# Patient Record
Sex: Female | Born: 1955 | Race: White | Hispanic: No | Marital: Married | Smoking: Former smoker
Health system: Southern US, Community
[De-identification: ages and names within clinical notes are randomized; demographics above are authoritative.]

## PROBLEM LIST (undated history)

## (undated) DIAGNOSIS — N393 Stress incontinence (female) (male): Secondary | ICD-10-CM

## (undated) DIAGNOSIS — K219 Gastro-esophageal reflux disease without esophagitis: Secondary | ICD-10-CM

## (undated) DIAGNOSIS — R51 Headache: Secondary | ICD-10-CM

## (undated) DIAGNOSIS — R519 Headache, unspecified: Secondary | ICD-10-CM

## (undated) DIAGNOSIS — E785 Hyperlipidemia, unspecified: Secondary | ICD-10-CM

## (undated) DIAGNOSIS — I10 Essential (primary) hypertension: Secondary | ICD-10-CM

## (undated) DIAGNOSIS — K449 Diaphragmatic hernia without obstruction or gangrene: Secondary | ICD-10-CM

## (undated) HISTORY — DX: Stress incontinence (female) (male): N39.3

## (undated) HISTORY — PX: TUBAL LIGATION: SHX77

## (undated) HISTORY — DX: Hyperlipidemia, unspecified: E78.5

## (undated) HISTORY — PX: BREAST SURGERY: SHX581

## (undated) HISTORY — DX: Essential (primary) hypertension: I10

## (undated) HISTORY — PX: BREAST EXCISIONAL BIOPSY: SUR124

---
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
## 2006-06-23 ENCOUNTER — Ambulatory Visit: Payer: Self-pay | Admitting: Obstetrics and Gynecology

## 2006-09-07 ENCOUNTER — Ambulatory Visit: Payer: Self-pay | Admitting: Gastroenterology

## 2006-09-07 HISTORY — PX: COLONOSCOPY: SHX174

## 2007-08-08 ENCOUNTER — Ambulatory Visit: Payer: Self-pay | Admitting: Nurse Practitioner

## 2007-08-20 ENCOUNTER — Ambulatory Visit: Payer: Self-pay | Admitting: Nurse Practitioner

## 2008-11-14 ENCOUNTER — Ambulatory Visit: Payer: Self-pay | Admitting: Unknown Physician Specialty

## 2008-11-18 ENCOUNTER — Ambulatory Visit: Payer: Self-pay | Admitting: Nurse Practitioner

## 2010-04-03 ENCOUNTER — Ambulatory Visit: Payer: Self-pay | Admitting: Internal Medicine

## 2010-07-12 ENCOUNTER — Ambulatory Visit: Payer: Self-pay | Admitting: Family Medicine

## 2013-12-06 ENCOUNTER — Ambulatory Visit: Payer: Self-pay | Admitting: Nurse Practitioner

## 2014-07-21 ENCOUNTER — Ambulatory Visit: Payer: Self-pay | Admitting: Gastroenterology

## 2014-07-21 HISTORY — PX: ESOPHAGOGASTRODUODENOSCOPY: SHX1529

## 2014-12-09 ENCOUNTER — Ambulatory Visit
Admit: 2014-12-09 | Disposition: A | Discharge status: Home / Self Care | Payer: Self-pay | Attending: Nurse Practitioner | Admitting: Nurse Practitioner

## 2014-12-22 LAB — SURGICAL PATHOLOGY

## 2015-12-29 ENCOUNTER — Emergency Department: Payer: BC Managed Care – PPO

## 2015-12-29 ENCOUNTER — Encounter: Payer: Self-pay | Admitting: Emergency Medicine

## 2015-12-29 ENCOUNTER — Emergency Department
Admission: EM | Admit: 2015-12-29 | Discharge: 2015-12-29 | Disposition: A | Discharge status: Home / Self Care | Payer: BC Managed Care – PPO | Attending: Emergency Medicine | Admitting: Emergency Medicine

## 2015-12-29 DIAGNOSIS — K859 Acute pancreatitis without necrosis or infection, unspecified: Secondary | ICD-10-CM | POA: Insufficient documentation

## 2015-12-29 DIAGNOSIS — K219 Gastro-esophageal reflux disease without esophagitis: Secondary | ICD-10-CM | POA: Insufficient documentation

## 2015-12-29 DIAGNOSIS — K8020 Calculus of gallbladder without cholecystitis without obstruction: Secondary | ICD-10-CM | POA: Diagnosis not present

## 2015-12-29 DIAGNOSIS — R110 Nausea: Secondary | ICD-10-CM | POA: Diagnosis present

## 2015-12-29 HISTORY — DX: Gastro-esophageal reflux disease without esophagitis: K21.9

## 2015-12-29 LAB — URINALYSIS COMPLETE WITH MICROSCOPIC (ARMC ONLY)
BILIRUBIN URINE: NEGATIVE
Bacteria, UA: NONE SEEN
Glucose, UA: NEGATIVE mg/dL
KETONES UR: NEGATIVE mg/dL
Nitrite: NEGATIVE
PH: 6 (ref 5.0–8.0)
PROTEIN: NEGATIVE mg/dL
SPECIFIC GRAVITY, URINE: 1.009 (ref 1.005–1.030)

## 2015-12-29 LAB — LIPASE, BLOOD: LIPASE: 237 U/L — AB (ref 11–51)

## 2015-12-29 LAB — COMPREHENSIVE METABOLIC PANEL
ALK PHOS: 87 U/L (ref 38–126)
ALT: 14 U/L (ref 14–54)
AST: 17 U/L (ref 15–41)
Albumin: 4.1 g/dL (ref 3.5–5.0)
Anion gap: 12 (ref 5–15)
BILIRUBIN TOTAL: 0.4 mg/dL (ref 0.3–1.2)
BUN: 6 mg/dL (ref 6–20)
CALCIUM: 8.9 mg/dL (ref 8.9–10.3)
CO2: 19 mmol/L — ABNORMAL LOW (ref 22–32)
CREATININE: 0.64 mg/dL (ref 0.44–1.00)
Chloride: 109 mmol/L (ref 101–111)
Glucose, Bld: 111 mg/dL — ABNORMAL HIGH (ref 65–99)
Potassium: 3.6 mmol/L (ref 3.5–5.1)
Sodium: 140 mmol/L (ref 135–145)
TOTAL PROTEIN: 7.5 g/dL (ref 6.5–8.1)

## 2015-12-29 LAB — CBC
HCT: 39.4 % (ref 35.0–47.0)
Hemoglobin: 13.5 g/dL (ref 12.0–16.0)
MCH: 30.4 pg (ref 26.0–34.0)
MCHC: 34.2 g/dL (ref 32.0–36.0)
MCV: 88.8 fL (ref 80.0–100.0)
PLATELETS: 366 10*3/uL (ref 150–440)
RBC: 4.44 MIL/uL (ref 3.80–5.20)
RDW: 14.1 % (ref 11.5–14.5)
WBC: 12.3 10*3/uL — AB (ref 3.6–11.0)

## 2015-12-29 MED ORDER — DOCUSATE SODIUM 100 MG PO CAPS: ORAL_CAPSULE | ORAL | Status: DC

## 2015-12-29 MED ORDER — OXYCODONE-ACETAMINOPHEN 5-325 MG PO TABS: 1.0000 | ORAL_TABLET | ORAL | Status: DC | PRN

## 2015-12-29 MED ORDER — ONDANSETRON 4 MG PO TBDP: ORAL_TABLET | ORAL | Status: DC

## 2015-12-29 MED ORDER — ONDANSETRON 4 MG PO TBDP
4.0000 mg | ORAL_TABLET | Freq: Once | ORAL | Status: AC
  Administered 2015-12-29: 4 mg via ORAL
  Filled 2015-12-29: qty 1

## 2015-12-29 NOTE — Discharge Instructions (Signed)
As we discussed, you have acute pancreatitis from an uncertain cause.  It is possible you may have passed a small gallstone that is irritating your pancreas.  We offered admission to the hospital for further management, but you declined at this time in favor of outpatient management with pain medication, nausea medication, and close follow-up.  Please return immediately to the emergency department if you develop any new or worsening symptoms or if you are unable to eat or drink sufficiently due to nausea or vomiting, if you develop a fever, uncontrolled pain, etc.   Acute Pancreatitis Acute pancreatitis is a disease in which the pancreas becomes suddenly inflamed. The pancreas is a large gland located behind your stomach. The pancreas produces enzymes that help digest food. The pancreas also releases the hormones glucagon and insulin that help regulate blood sugar. Damage to the pancreas occurs when the digestive enzymes from the pancreas are activated and begin attacking the pancreas before being released into the intestine. Most acute attacks last a couple of days and can cause serious complications. Some people become dehydrated and develop low blood pressure. In severe cases, bleeding into the pancreas can lead to shock and can be life-threatening. The lungs, heart, and kidneys may fail. CAUSES  Pancreatitis can happen to anyone. In some cases, the cause is unknown. Most cases are caused by:  Alcohol abuse.  Gallstones. Other less common causes are:  Certain medicines.  Exposure to certain chemicals.  Infection.  Damage caused by an accident (trauma).  Abdominal surgery. SYMPTOMS   Pain in the upper abdomen that may radiate to the back.  Tenderness and swelling of the abdomen.  Nausea and vomiting. DIAGNOSIS  Your caregiver will perform a physical exam. Blood and stool tests may be done to confirm the diagnosis. Imaging tests may also be done, such as X-rays, CT scans, or an  ultrasound of the abdomen. TREATMENT  Treatment usually requires a stay in the hospital. Treatment may include:  Pain medicine.  Fluid replacement through an intravenous line (IV).  Placing a tube in the stomach to remove stomach contents and control vomiting.  Not eating for 3 or 4 days. This gives your pancreas a rest, because enzymes are not being produced that can cause further damage.  Antibiotic medicines if your condition is caused by an infection.  Surgery of the pancreas or gallbladder. HOME CARE INSTRUCTIONS   Follow the diet advised by your caregiver. This may involve avoiding alcohol and decreasing the amount of fat in your diet.  Eat smaller, more frequent meals. This reduces the amount of digestive juices the pancreas produces.  Drink enough fluids to keep your urine clear or pale yellow.  Only take over-the-counter or prescription medicines as directed by your caregiver.  Avoid drinking alcohol if it caused your condition.  Do not smoke.  Get plenty of rest.  Check your blood sugar at home as directed by your caregiver.  Keep all follow-up appointments as directed by your caregiver. SEEK MEDICAL CARE IF:   You do not recover as quickly as expected.  You develop new or worsening symptoms.  You have persistent pain, weakness, or nausea.  You recover and then have another episode of pain. SEEK IMMEDIATE MEDICAL CARE IF:   You are unable to eat or keep fluids down.  Your pain becomes severe.  You have a fever or persistent symptoms for more than 2 to 3 days.  You have a fever and your symptoms suddenly get worse.  Your skin  or the white part of your eyes turn yellow (jaundice).  You develop vomiting.  You feel dizzy, or you faint.  Your blood sugar is high (over 300 mg/dL). MAKE SURE YOU:   Understand these instructions.  Will watch your condition.  Will get help right away if you are not doing well or get worse.   This information is not  intended to replace advice given to you by your health care provider. Make sure you discuss any questions you have with your health care provider.   Document Released: 08/15/2005 Document Revised: 02/14/2012 Document Reviewed: 11/24/2011 Elsevier Interactive Patient Education 2016 [employer].  Cholelithiasis Cholelithiasis (also called gallstones) is a form of gallbladder disease in which gallstones form in your gallbladder. The gallbladder is an organ that stores bile made in the liver, which helps digest fats. Gallstones begin as small crystals and slowly grow into stones. Gallstone pain occurs when the gallbladder spasms and a gallstone is blocking the duct. Pain can also occur when a stone passes out of the duct.  RISK FACTORS  Being female.   Having multiple pregnancies. Health care providers sometimes advise removing diseased gallbladders before future pregnancies.   Being obese.  Eating a diet heavy in fried foods and fat.   Being older than 37 years and increasing age.   Prolonged use of medicines containing female hormones.   Having diabetes mellitus.   Rapidly losing weight.   Having a family history of gallstones (heredity).  SYMPTOMS  Nausea.   Vomiting.  Abdominal pain.   Yellowing of the skin (jaundice).   Sudden pain. It may persist from several minutes to several hours.  Fever.   Tenderness to the touch. In some cases, when gallstones do not move into the bile duct, people have no pain or symptoms. These are called "silent" gallstones.  TREATMENT Silent gallstones do not need treatment. In severe cases, emergency surgery may be required. Options for treatment include:  Surgery to remove the gallbladder. This is the most common treatment.  Medicines. These do not always work and may take 6-12 months or more to work.  Shock wave treatment (extracorporeal biliary lithotripsy). In this treatment an ultrasound machine sends shock waves to  the gallbladder to break gallstones into smaller pieces that can pass into the intestines or be dissolved by medicine. HOME CARE INSTRUCTIONS   Only take over-the-counter or prescription medicines for pain, discomfort, or fever as directed by your health care provider.   Follow a low-fat diet until seen again by your health care provider. Fat causes the gallbladder to contract, which can result in pain.   Follow up with your health care provider as directed. Attacks are almost always recurrent and surgery is usually required for permanent treatment.  SEEK IMMEDIATE MEDICAL CARE IF:   Your pain increases and is not controlled by medicines.   You have a fever or persistent symptoms for more than 2-3 days.   You have a fever and your symptoms suddenly get worse.   You have persistent nausea and vomiting.  MAKE SURE YOU:   Understand these instructions.  Will watch your condition.  Will get help right away if you are not doing well or get worse.   This information is not intended to replace advice given to you by your health care provider. Make sure you discuss any questions you have with your health care provider.   Document Released: 08/11/2005 Document Revised: 04/17/2013 Document Reviewed: 02/06/2013 Elsevier Interactive Patient Education [employer].

## 2015-12-29 NOTE — ED Notes (Signed)
Pt to ed with c/o LLQ sent from [facility].  Pt recently treated for UTI however, recent urine cultures were wnl.

## 2015-12-29 NOTE — ED Provider Notes (Signed)
[facility] Emergency Department Provider Note  ____________________________________________  Time seen: Approximately 6:38 PM  I have reviewed the triage vital signs and the nursing notes.   HISTORY  Chief Complaint Abdominal Pain    HPI Yoshino Jutras is a 60 y.o. female with a past medical history of acid reflux and tobacco use who presents with about 5 days of gradually worsening upper abdominal pain and nausea.  She reports it is worse on the left but also present on the right and in the epigastrium.  Her nausea has been getting worse but she has not had any vomiting.  She has had a loss of appetite but also notes that her symptoms are worse after eating.She denies chest pain, shortness of breath, dysuria.  She was recently seen at an urgent care clinic and treated for UTI but the urine culture ended up being normal.  She went back today for a follow-up visit and was sent over here because of the worsening symptoms and normal cultures.  She has never had her gallbladder removed.  She does not drink alcohol.  She has not had any medication changes recently.  She describes her pain as moderate at this time and movement makes it worse and nothing makes it better.  She describes it as both a sharp pain and an aching pain present throughout her upper abdomen but worse on the left.   Past Medical History  Diagnosis Date  . GERD (gastroesophageal reflux disease)     There are no active problems to display for this patient.   History reviewed. No pertinent past surgical history.  Current Outpatient Rx  Name  Route  Sig  Dispense  Refill  . docusate sodium (COLACE) 100 MG capsule      Take 1 tablet once or twice daily as needed for constipation while taking narcotic pain medicine   30 capsule   0   . ondansetron (ZOFRAN ODT) 4 MG disintegrating tablet      Allow 1-2 tablets to dissolve in your mouth every 8 hours as needed for nausea/vomiting  30 tablet   0   . oxyCODONE-acetaminophen (ROXICET) 5-325 MG tablet   Oral   Take 1-2 tablets by mouth every 4 (four) hours as needed for severe pain.   30 tablet   0     Allergies Review of patient's allergies indicates no known allergies.  No family history on file.  Social History Social History  Substance Use Topics  . Smoking status: Never Smoker   . Smokeless tobacco: None  . Alcohol Use: No    Review of Systems Constitutional: No fever/chills Eyes: No visual changes. ENT: No sore throat. Cardiovascular: Denies chest pain. Respiratory: Denies shortness of breath. Gastrointestinal: Upper abdominal pain worse on the left with nausea but no vomiting.  No diarrhea nor constipation Genitourinary: Negative for dysuria. Musculoskeletal: Negative for back pain. Skin: Negative for rash. Neurological: Negative for headaches, focal weakness or numbness.  10-point ROS otherwise negative.  ____________________________________________   PHYSICAL EXAM:  VITAL SIGNS: ED Triage Vitals  Enc Vitals Group     BP 12/29/15 1718 139/73 mmHg     Pulse Rate 12/29/15 1718 96     Resp 12/29/15 1718 15     Temp 12/29/15 1718 99.2 F (37.3 C)     Temp Source 12/29/15 1718 Oral     SpO2 12/29/15 1718 99 %     Weight 12/29/15 1718 174 lb (78.926 kg)  Height 12/29/15 1718 5\' 7"  (1.702 m)     Head Cir --      Peak Flow --      Pain Score 12/29/15 1637 7     Pain Loc --      Pain Edu? --      Excl. in [place]? --     Constitutional: Alert and oriented. Well appearing and in no acute distress. Eyes: Conjunctivae are normal. PERRL. EOMI. Head: Atraumatic. Nose: No congestion/rhinnorhea. Mouth/Throat: Mucous membranes are moist.  Oropharynx non-erythematous. Neck: No stridor.  No meningeal signs.   Cardiovascular: Normal rate, regular rhythm. Good peripheral circulation. Grossly normal heart sounds.   Respiratory: Normal respiratory effort.  No retractions. Lungs  CTAB. Gastrointestinal: Soft with tenderness to palpation of the left upper quadrant, epigastrium, and right upper quadrant with a positive Murphy sign.  No lower abdominal tenderness.  No distention. Musculoskeletal: No lower extremity tenderness nor edema. No gross deformities of extremities. Neurologic:  Normal speech and language. No gross focal neurologic deficits are appreciated.  Skin:  Skin is warm, dry and intact. No rash noted. Psychiatric: Mood and affect are normal. Speech and behavior are normal.  ____________________________________________   LABS (all labs ordered are listed, but only abnormal results are displayed)  Labs Reviewed  LIPASE, BLOOD - Abnormal; Notable for the following:    Lipase 237 (*)    All other components within normal limits  COMPREHENSIVE METABOLIC PANEL - Abnormal; Notable for the following:    CO2 19 (*)    Glucose, Bld 111 (*)    All other components within normal limits  CBC - Abnormal; Notable for the following:    WBC 12.3 (*)    All other components within normal limits  URINALYSIS COMPLETEWITH MICROSCOPIC ([facility] ONLY) - Abnormal; Notable for the following:    Color, Urine YELLOW (*)    APPearance CLEAR (*)    Hgb urine dipstick 1+ (*)    Leukocytes, UA TRACE (*)    Squamous Epithelial / LPF 6-30 (*)    All other components within normal limits   ____________________________________________  EKG  None ____________________________________________  RADIOLOGY   US Abdomen Limited Ruq  12/29/2015  CLINICAL DATA:  Upper abdominal pain with nausea.  Elevated lipase. EXAM: US ABDOMEN LIMITED - RIGHT UPPER QUADRANT COMPARISON:  None. FINDINGS: Gallbladder: There is a mobile 1.4 cm calculus within the gallbladder fundus. Several additional tiny calculi are suspected but not as conclusively documented. There is no gallbladder wall thickening or pericholecystic fluid. Common bile duct: Diameter: Normal, 5.7 mm. Liver: Mildly increased  echogenicity of hepatic parenchyma without focal lesion. No intrahepatic bile duct dilatation. IMPRESSION: Cholelithiasis.  Fatty liver. Electronically Signed   By: Andreas Newport M.D.   On: 12/29/2015 19:43    ____________________________________________   PROCEDURES  Procedure(s) performed: None  Critical Care performed: No ____________________________________________   INITIAL IMPRESSION / ASSESSMENT AND PLAN / ED COURSE  Pertinent labs & imaging results that were available during my care of the patient were reviewed by me and considered in my medical decision making (see chart for details).  Patient's labs are notable for a mild leukocytosis and a positive lipase consistent with pancreatitis.  She does not drink alcohol and has had no medication changes.  The pancreatitis may be idiopathic, but I will obtain a right upper quadrant ultrasound to evaluate her gallbladder given the probability that she may have cholecystitis or choledocholithiasis leading to pancreatitis.  She declines pain medication at this time but would  like an ODT Zofran.   (Note that documentation was delayed due to multiple ED patients requiring immediate care.)   The patient's ultrasound demonstrated cholelithiasis without cholecystitis.  The CBD is normal in diameter.  I discussed the results with the patient and explained that I cannot be certain she has not passed a small stone which is irritating her pancreas.  I offered admission for further evaluation, pain control, and IV fluids, but she strongly prefers to try conservative outpatient management.  I gave her strict return precautions and made her promise to return if she cannot tolerate PO intake or gets worse in any way.    ____________________________________________  FINAL CLINICAL IMPRESSION(S) / ED DIAGNOSES  Final diagnoses:  Acute pancreatitis, unspecified pancreatitis type  Cholelithiasis without cholecystitis     MEDICATIONS GIVEN  DURING THIS VISIT:  Medications  ondansetron (ZOFRAN-ODT) disintegrating tablet 4 mg (4 mg Oral Given 12/29/15 1852)     NEW OUTPATIENT MEDICATIONS STARTED DURING THIS VISIT:  Discharge Medication List as of 12/29/2015  8:58 PM    START taking these medications   Details  docusate sodium (COLACE) 100 MG capsule Take 1 tablet once or twice daily as needed for constipation while taking narcotic pain medicine, Print    ondansetron (ZOFRAN ODT) 4 MG disintegrating tablet Allow 1-2 tablets to dissolve in your mouth every 8 hours as needed for nausea/vomiting, Print    oxyCODONE-acetaminophen (ROXICET) 5-325 MG tablet Take 1-2 tablets by mouth every 4 (four) hours as needed for severe pain., Starting 12/29/2015, Until Discontinued, Print          Note:  This document was prepared using Dragon voice recognition software and may include unintentional dictation errors.   Hinda Kehr, MD 12/29/15 [phone]

## 2015-12-29 NOTE — ED Notes (Signed)
RN asked if pain was too intense to wait for prescriptions and pt reported she did not want pain medication at ED. Pt ambulatory and in no acute distress at time of d/c.

## 2016-01-04 ENCOUNTER — Other Ambulatory Visit: Payer: Self-pay

## 2016-01-04 DIAGNOSIS — E559 Vitamin D deficiency, unspecified: Secondary | ICD-10-CM | POA: Insufficient documentation

## 2016-01-04 DIAGNOSIS — E785 Hyperlipidemia, unspecified: Secondary | ICD-10-CM | POA: Insufficient documentation

## 2016-01-04 DIAGNOSIS — K219 Gastro-esophageal reflux disease without esophagitis: Secondary | ICD-10-CM | POA: Insufficient documentation

## 2016-01-04 DIAGNOSIS — IMO0001 Reserved for inherently not codable concepts without codable children: Secondary | ICD-10-CM | POA: Insufficient documentation

## 2016-01-04 DIAGNOSIS — R030 Elevated blood-pressure reading, without diagnosis of hypertension: Secondary | ICD-10-CM

## 2016-01-04 DIAGNOSIS — N393 Stress incontinence (female) (male): Secondary | ICD-10-CM | POA: Insufficient documentation

## 2016-01-05 ENCOUNTER — Ambulatory Visit (INDEPENDENT_AMBULATORY_CARE_PROVIDER_SITE_OTHER): Payer: BC Managed Care – PPO | Admitting: General Surgery

## 2016-01-05 ENCOUNTER — Encounter: Payer: Self-pay | Admitting: General Surgery

## 2016-01-05 ENCOUNTER — Other Ambulatory Visit: Payer: Self-pay

## 2016-01-05 VITALS — BP 163/70 | HR 86 | Temp 98.7°F | Wt 178.0 lb

## 2016-01-05 DIAGNOSIS — Z01810 Encounter for preprocedural cardiovascular examination: Secondary | ICD-10-CM

## 2016-01-05 DIAGNOSIS — K851 Biliary acute pancreatitis without necrosis or infection: Secondary | ICD-10-CM | POA: Insufficient documentation

## 2016-01-05 NOTE — Progress Notes (Signed)
Patient ID: Diana Wilkins, female   DOB: [birth year], 60 y.o.   MRN: [id number]  CC: ABDOMINAL PAIN  HPI Diana Wilkins is a 60 y.o. female who presents to clinic today for evaluation of abdominal pain. Patient states she was in the emergency department and urgent cares numerous times in the last several weeks and was found to have a mild pancreatitis and cholelithiasis. This was suggestive of gallstone pancreatitis. She states that the pain wrapped around her entire upper abdomen and felt like a hot fire poker stabbing into her right upper quadrant at times. This was worsened by eating and relieved by pain medications. She also was thought to have a urinary tract infection that time. Was treated with antibiotics. She states the pain has somewhat subsided but she has been afraid to eat and has had decreased oral intake. She's also had intermittent nausea and vomiting but none in the last several days. She denies any fevers, chills, chest pain, shortness breath, diarrhea, constipation.  HPI  Past Medical History  Diagnosis Date  . GERD (gastroesophageal reflux disease)   . Hypertension   . Hyperlipidemia   . Stress incontinence     Past Surgical History  Procedure Laterality Date  . Tubal ligation    . Colonoscopy  09/07/2006  . Tonsillectomy  1970  . Esophagogastroduodenoscopy  07/21/2014    History reviewed. No pertinent family history.  Social History Social History  Substance Use Topics  . Smoking status: Current Every Day Smoker -- 0.50 packs/day for 41 years    Types: Cigarettes  . Smokeless tobacco: Never Used  . Alcohol Use: No    Allergies  Allergen Reactions  . Amoxicillin-Pot Clavulanate Nausea Only    Current Outpatient Prescriptions  Medication Sig Dispense Refill  . Cholecalciferol (VITAMIN D-3) 1000 units CAPS Take 1 capsule by mouth daily.    Marland Kitchen docusate sodium (COLACE) 100 MG capsule Take 1 tablet once or twice daily as needed for constipation while  taking narcotic pain medicine 30 capsule 0  . nitrofurantoin, macrocrystal-monohydrate, (MACROBID) 100 MG capsule Take 1 capsule by mouth 2 (two) times daily.    Marland Kitchen omeprazole (PRILOSEC) 40 MG capsule Take 40 mg by mouth daily.    . ondansetron (ZOFRAN ODT) 4 MG disintegrating tablet Allow 1-2 tablets to dissolve in your mouth every 8 hours as needed for nausea/vomiting 30 tablet 0  . oxybutynin (DITROPAN-XL) 5 MG 24 hr tablet Take 5 mg by mouth 2 (two) times daily.    Marland Kitchen oxyCODONE-acetaminophen (ROXICET) 5-325 MG tablet Take 1-2 tablets by mouth every 4 (four) hours as needed for severe pain. 30 tablet 0  . phenazopyridine (PYRIDIUM) 200 MG tablet Take 1 tablet by mouth daily.  0   No current facility-administered medications for this visit.     Review of Systems A Multi-point review of systems was asked and was negative except for the findings documented in the history of present illness  Physical Exam Blood pressure 163/70, pulse 86, temperature 98.7 F (37.1 C), temperature source Oral, weight 80.74 kg (178 lb). CONSTITUTIONAL: No acute distress. EYES: Pupils are equal, round, and reactive to light, Sclera are non-icteric. EARS, NOSE, MOUTH AND THROAT: The oropharynx is clear. The oral mucosa is pink and moist. Hearing is intact to voice. LYMPH NODES:  Lymph nodes in the neck are normal. RESPIRATORY:  Lungs are clear. There is normal respiratory effort, with equal breath sounds bilaterally, and without pathologic use of accessory muscles. CARDIOVASCULAR: Heart is regular without murmurs,  gallops, or rubs. GI: The abdomen is  soft, nontender, and nondistended. There are no palpable masses. There is no hepatosplenomegaly. There are normal bowel sounds in all quadrants. GU: Rectal deferred.   MUSCULOSKELETAL: Normal muscle strength and tone. No cyanosis or edema.   SKIN: Turgor is good and there are no pathologic skin lesions or ulcers. NEUROLOGIC: Motor and sensation is grossly normal.  Cranial nerves are grossly intact. PSYCH:  Oriented to person, place and time. Affect is normal.  Data Reviewed Images and labs reviewed, patient had a mild leukocytosis of 12.3, a mild pancreatitis with a lipase of 237 on ER visit 1 week ago. Ultrasound showing cholelithiasis without evidence of gallbladder wall thickening, pericholecystic fluid, ductal dilatation. I have personally reviewed the patient's imaging, laboratory findings and medical records.    Assessment    Gallstone pancreatitis    Plan    A long conversation with the patient about the diagnosis of gallstone pancreatitis. Patient is not a drinker making her small gallstones seen on ultrasound the most likely cause of her mild pancreatitis. Discussed the limited to decrease the chance of this happening again we remove her gallbladder. I discussed the procedure of a laparoscopic cholecystectomy with intraoperative cholangiogram in detail.  The patient was given Neurosurgeon.  We discussed the risks and benefits of a laparoscopic cholecystectomy and possible cholangiogram including, but not limited to bleeding, infection, injury to surrounding structures such as the intestine or liver, bile leak, retained gallstones, need to convert to an open procedure, prolonged diarrhea, blood clots such as  DVT, common bile duct injury, anesthesia risks, and possible need for additional procedures.  The likelihood of improvement in symptoms and return to the patient's normal status is good. We discussed the typical post-operative recovery course. Patient voiced understanding and wishes to proceed. We will plan for her laparoscopic cholecystectomy on Tuesday, May 23.      Time spent with the patient was 55 minutes, with more than 50% of the time spent in face-to-face education, counseling and care coordination.     Clayburn Pert, MD FACS General Surgeon 01/05/2016, 11:19 AM

## 2016-01-05 NOTE — Patient Instructions (Addendum)
You have requested to have your Gallbladder removed. We will arrange this to be done on Jan 19, 2016 at [place] will be off from work for approximately 1-2 weeks depending on your recovery.   Please avoid greasy and fried foods if at all possible prior to your scheduled surgery to decrease symptoms until then.  Please see the [facility]) pre-care form you have been given today.  If you have any questions or concerns please call our office.   Please call our office if you have any questions or concerns. Blue sheet was discussed and provided to patient.

## 2016-01-06 ENCOUNTER — Telehealth: Payer: Self-pay | Admitting: General Surgery

## 2016-01-06 NOTE — Telephone Encounter (Signed)
Pt advised of pre op date/time and sx date. Sx: 01/19/16 with Dr Gabrielle Dare cholecystectomy with IOC. Pre op: 01/11/16 between 1-5:00pm--Phone.   Patient made aware to call [phone], between 1-3:00pm the day before surgery, to find out what time to arrive.     Patient has agreed to pay 200.00 deposit towards surgery for physician charges.  Deductible remaining--1080.00 Co insurance--30% Estimate (physician) [phone]

## 2016-01-11 ENCOUNTER — Encounter: Payer: Self-pay | Admitting: *Deleted

## 2016-01-11 ENCOUNTER — Other Ambulatory Visit: Payer: BC Managed Care – PPO

## 2016-01-11 NOTE — Patient Instructions (Addendum)
  Your procedure is scheduled on: 01-19-16 (TUESDAY) Report to [facility]. To find out your arrival time please call [phone] between 1PM - 3PM on 01-18-16 [facility])  Remember: Instructions that are not followed completely may result in serious medical risk, up to and including death, or upon the discretion of your surgeon and anesthesiologist your surgery may need to be rescheduled.    _X___ 1. Do not eat food or drink liquids after midnight. No gum chewing or hard candies.     _X___ 2. No Alcohol for 24 hours before or after surgery.   ____ 3. Bring all medications with you on the day of surgery if instructed.    _X___ 4. Notify your doctor if there is any change in your medical condition     (cold, fever, infections).     Do not wear jewelry, make-up, hairpins, clips or nail polish.  Do not wear lotions, powders, or perfumes. You may wear deodorant.  Do not shave 48 hours prior to surgery. Men may shave face and neck.  Do not bring valuables to the hospital.    [facility] is not responsible for any belongings or valuables.               Contacts, dentures or bridgework may not be worn into surgery.  Leave your suitcase in the car. After surgery it may be brought to your room.  For patients admitted to the hospital, discharge time is determined by your treatment team.   Patients discharged the day of surgery will not be allowed to drive home.   Please read over the following fact sheets that you were given:      _X___ Take these medicines the morning of surgery with A SIP OF WATER:    1. OXYBUTYNIN (DITROPAN)  2. PRILOSEC (OMEPRAZOLE)  3.   4.  5.  6.  ____ Fleet Enema (as directed)   _X___ Use CHG Soap as directed  ____ Use inhalers on the day of surgery  ____ Stop metformin 2 days prior to surgery    ____ Take 1/2 of usual insulin dose the night before surgery and none on the morning of surgery.   ____ Stop  Coumadin/Plavix/aspirin-N/A  _X___ Stop Anti-inflammatories-NO NSAIDS OR ASPIRIN PRODUCTS-PERCOCET/TYLENOL OK TO TAKE   ____ Stop supplements until after surgery.    ____ Bring C-Pap to the hospital.

## 2016-01-13 ENCOUNTER — Ambulatory Visit
Admission: RE | Admit: 2016-01-13 | Discharge: 2016-01-13 | Disposition: A | Discharge status: Home / Self Care | Payer: BC Managed Care – PPO | Source: Ambulatory Visit | Attending: General Surgery | Admitting: General Surgery

## 2016-01-13 ENCOUNTER — Encounter
Admission: RE | Admit: 2016-01-13 | Discharge: 2016-01-13 | Disposition: A | Discharge status: Home / Self Care | Payer: BC Managed Care – PPO | Source: Ambulatory Visit | Attending: General Surgery | Admitting: General Surgery

## 2016-01-13 DIAGNOSIS — Z01810 Encounter for preprocedural cardiovascular examination: Secondary | ICD-10-CM

## 2016-01-13 DIAGNOSIS — K851 Biliary acute pancreatitis without necrosis or infection: Secondary | ICD-10-CM | POA: Insufficient documentation

## 2016-01-13 LAB — COMPREHENSIVE METABOLIC PANEL
ALBUMIN: 4 g/dL (ref 3.5–5.0)
ALK PHOS: 86 U/L (ref 38–126)
ALT: 16 U/L (ref 14–54)
ANION GAP: 7 (ref 5–15)
AST: 18 U/L (ref 15–41)
BUN: 8 mg/dL (ref 6–20)
CALCIUM: 9.1 mg/dL (ref 8.9–10.3)
CO2: 26 mmol/L (ref 22–32)
Chloride: 109 mmol/L (ref 101–111)
Creatinine, Ser: 0.66 mg/dL (ref 0.44–1.00)
GFR calc Af Amer: >60 mL/min (ref >60)
GFR calc non Af Amer: >60 mL/min (ref >60)
GLUCOSE: 109 mg/dL — AB (ref 65–99)
POTASSIUM: 3.5 mmol/L (ref 3.5–5.1)
SODIUM: 142 mmol/L (ref 135–145)
Total Bilirubin: 0.4 mg/dL (ref 0.3–1.2)
Total Protein: 7.2 g/dL (ref 6.5–8.1)

## 2016-01-13 LAB — CBC WITH DIFFERENTIAL/PLATELET
BASOS PCT: 1 %
Basophils Absolute: 0.1 10*3/uL (ref 0–0.1)
EOS ABS: 0.2 10*3/uL (ref 0–0.7)
Eosinophils Relative: 2 %
HCT: 38.6 % (ref 35.0–47.0)
HEMOGLOBIN: 13 g/dL (ref 12.0–16.0)
Lymphocytes Relative: 34 %
Lymphs Abs: 3 10*3/uL (ref 1.0–3.6)
MCH: 30.3 pg (ref 26.0–34.0)
MCHC: 33.7 g/dL (ref 32.0–36.0)
MCV: 89.7 fL (ref 80.0–100.0)
MONOS PCT: 6 %
Monocytes Absolute: 0.5 10*3/uL (ref 0.2–0.9)
NEUTROS PCT: 57 %
Neutro Abs: 5 10*3/uL (ref 1.4–6.5)
Platelets: 282 10*3/uL (ref 150–440)
RBC: 4.31 MIL/uL (ref 3.80–5.20)
RDW: 14 % (ref 11.5–14.5)
WBC: 8.8 10*3/uL (ref 3.6–11.0)

## 2016-01-13 LAB — PROTIME-INR
INR: 0.99
Prothrombin Time: 13.3 seconds (ref 11.4–15.0)

## 2016-01-13 LAB — APTT: APTT: 28 s (ref 24–36)

## 2016-01-19 ENCOUNTER — Ambulatory Visit
Admission: RE | Admit: 2016-01-19 | Discharge: 2016-01-19 | Disposition: A | Discharge status: Home / Self Care | Payer: BC Managed Care – PPO | Source: Ambulatory Visit | Attending: General Surgery | Admitting: General Surgery

## 2016-01-19 ENCOUNTER — Encounter: Payer: Self-pay | Admitting: *Deleted

## 2016-01-19 ENCOUNTER — Encounter
Admission: RE | Disposition: A | Discharge status: Home / Self Care | Payer: Self-pay | Source: Ambulatory Visit | Attending: General Surgery

## 2016-01-19 ENCOUNTER — Ambulatory Visit: Payer: BC Managed Care – PPO

## 2016-01-19 ENCOUNTER — Ambulatory Visit: Payer: BC Managed Care – PPO | Admitting: Anesthesiology

## 2016-01-19 DIAGNOSIS — K8010 Calculus of gallbladder with chronic cholecystitis without obstruction: Secondary | ICD-10-CM | POA: Diagnosis not present

## 2016-01-19 DIAGNOSIS — N393 Stress incontinence (female) (male): Secondary | ICD-10-CM | POA: Insufficient documentation

## 2016-01-19 DIAGNOSIS — K851 Biliary acute pancreatitis without necrosis or infection: Secondary | ICD-10-CM | POA: Diagnosis present

## 2016-01-19 DIAGNOSIS — Z881 Allergy status to other antibiotic agents status: Secondary | ICD-10-CM | POA: Diagnosis not present

## 2016-01-19 DIAGNOSIS — K819 Cholecystitis, unspecified: Secondary | ICD-10-CM | POA: Insufficient documentation

## 2016-01-19 DIAGNOSIS — E785 Hyperlipidemia, unspecified: Secondary | ICD-10-CM | POA: Diagnosis not present

## 2016-01-19 DIAGNOSIS — I10 Essential (primary) hypertension: Secondary | ICD-10-CM | POA: Insufficient documentation

## 2016-01-19 DIAGNOSIS — F17210 Nicotine dependence, cigarettes, uncomplicated: Secondary | ICD-10-CM | POA: Diagnosis not present

## 2016-01-19 DIAGNOSIS — Z79899 Other long term (current) drug therapy: Secondary | ICD-10-CM | POA: Diagnosis not present

## 2016-01-19 DIAGNOSIS — K219 Gastro-esophageal reflux disease without esophagitis: Secondary | ICD-10-CM | POA: Insufficient documentation

## 2016-01-19 DIAGNOSIS — R51 Headache: Secondary | ICD-10-CM | POA: Diagnosis not present

## 2016-01-19 HISTORY — PX: CHOLECYSTECTOMY: SHX55

## 2016-01-19 HISTORY — DX: Headache, unspecified: R51.9

## 2016-01-19 HISTORY — DX: Headache: R51

## 2016-01-19 SURGERY — LAPAROSCOPIC CHOLECYSTECTOMY WITH INTRAOPERATIVE CHOLANGIOGRAM
Anesthesia: General | Wound class: Clean Contaminated

## 2016-01-19 MED ORDER — BUPIVACAINE HCL 0.5 % IJ SOLN
INTRAMUSCULAR | Status: DC | PRN
  Administered 2016-01-19: 29 mL

## 2016-01-19 MED ORDER — LIDOCAINE HCL (CARDIAC) 20 MG/ML IV SOLN
INTRAVENOUS | Status: DC | PRN
  Administered 2016-01-19: 50 mg via INTRAVENOUS

## 2016-01-19 MED ORDER — ONDANSETRON HCL 4 MG/2ML IJ SOLN
INTRAMUSCULAR | Status: DC | PRN
  Administered 2016-01-19: 4 mg via INTRAVENOUS

## 2016-01-19 MED ORDER — ONDANSETRON HCL 4 MG/2ML IJ SOLN
4.0000 mg | Freq: Once | INTRAMUSCULAR | Status: AC | PRN
  Administered 2016-01-19: 4 mg via INTRAVENOUS

## 2016-01-19 MED ORDER — SUCCINYLCHOLINE CHLORIDE 20 MG/ML IJ SOLN
INTRAMUSCULAR | Status: DC | PRN
  Administered 2016-01-19: 80 mg via INTRAVENOUS

## 2016-01-19 MED ORDER — ONDANSETRON HCL 4 MG/2ML IJ SOLN
INTRAMUSCULAR | Status: DC
Start: 2016-01-19 — End: 2016-01-19
  Filled 2016-01-19: qty 2

## 2016-01-19 MED ORDER — CHLORHEXIDINE GLUCONATE 4 % EX LIQD: 1.0000 "application " | Freq: Once | CUTANEOUS | Status: DC

## 2016-01-19 MED ORDER — CIPROFLOXACIN IN D5W 400 MG/200ML IV SOLN
400.0000 mg | INTRAVENOUS | Status: DC
  Administered 2016-01-19: 400 mg via INTRAVENOUS

## 2016-01-19 MED ORDER — FENTANYL CITRATE (PF) 100 MCG/2ML IJ SOLN
25.0000 ug | INTRAMUSCULAR | Status: AC | PRN
  Administered 2016-01-19: 150 ug via INTRAVENOUS
  Administered 2016-01-19 (×5): 25 ug via INTRAVENOUS

## 2016-01-19 MED ORDER — MIDAZOLAM HCL 2 MG/2ML IJ SOLN
INTRAMUSCULAR | Status: DC | PRN
  Administered 2016-01-19: 2 mg via INTRAVENOUS

## 2016-01-19 MED ORDER — LIDOCAINE HCL (PF) 1 % IJ SOLN
INTRAMUSCULAR | Status: AC
  Filled 2016-01-19: qty 30

## 2016-01-19 MED ORDER — METRONIDAZOLE IN NACL 5-0.79 MG/ML-% IV SOLN
500.0000 mg | INTRAVENOUS | Status: DC
  Filled 2016-01-19: qty 100

## 2016-01-19 MED ORDER — ROCURONIUM BROMIDE 100 MG/10ML IV SOLN
INTRAVENOUS | Status: DC | PRN
  Administered 2016-01-19: 5 mg via INTRAVENOUS
  Administered 2016-01-19: 20 mg via INTRAVENOUS

## 2016-01-19 MED ORDER — FENTANYL CITRATE (PF) 100 MCG/2ML IJ SOLN
INTRAMUSCULAR | Status: AC
  Administered 2016-01-19: 25 ug
  Filled 2016-01-19: qty 2

## 2016-01-19 MED ORDER — GLYCOPYRROLATE 0.2 MG/ML IJ SOLN
INTRAMUSCULAR | Status: DC | PRN
  Administered 2016-01-19: 0.2 mg via INTRAVENOUS

## 2016-01-19 MED ORDER — OXYCODONE-ACETAMINOPHEN 5-325 MG PO TABS
ORAL_TABLET | ORAL | Status: AC
  Administered 2016-01-19: 1
  Filled 2016-01-19: qty 1

## 2016-01-19 MED ORDER — CIPROFLOXACIN IN D5W 400 MG/200ML IV SOLN
INTRAVENOUS | Status: AC
  Administered 2016-01-19: 400 mg via INTRAVENOUS
  Filled 2016-01-19: qty 200

## 2016-01-19 MED ORDER — PROPOFOL 10 MG/ML IV BOLUS
INTRAVENOUS | Status: DC | PRN
  Administered 2016-01-19: 150 mg via INTRAVENOUS

## 2016-01-19 MED ORDER — BUPIVACAINE HCL (PF) 0.5 % IJ SOLN
INTRAMUSCULAR | Status: AC
Start: 2016-01-19 — End: 2016-01-19
  Filled 2016-01-19: qty 30

## 2016-01-19 MED ORDER — OXYCODONE-ACETAMINOPHEN 5-325 MG PO TABS: 1.0000 | ORAL_TABLET | ORAL | Status: DC | PRN

## 2016-01-19 MED ORDER — SUGAMMADEX SODIUM 200 MG/2ML IV SOLN
INTRAVENOUS | Status: DC | PRN
  Administered 2016-01-19: 161.4 mg via INTRAVENOUS

## 2016-01-19 MED ORDER — IOTHALAMATE MEGLUMINE 60 % INJ SOLN
INTRAMUSCULAR | Status: DC | PRN
  Administered 2016-01-19: 6 mL

## 2016-01-19 MED ORDER — DEXAMETHASONE SODIUM PHOSPHATE 10 MG/ML IJ SOLN
INTRAMUSCULAR | Status: DC | PRN
  Administered 2016-01-19: 5 mg via INTRAVENOUS

## 2016-01-19 MED ORDER — ACETAMINOPHEN 10 MG/ML IV SOLN
1000.0000 mg | Freq: Four times a day (QID) | INTRAVENOUS | Status: DC
  Administered 2016-01-19: 1000 mg via INTRAVENOUS

## 2016-01-19 MED ORDER — LACTATED RINGERS IV SOLN
INTRAVENOUS | Status: DC
  Administered 2016-01-19: 09:00:00 via INTRAVENOUS

## 2016-01-19 MED ORDER — ACETAMINOPHEN 10 MG/ML IV SOLN
INTRAVENOUS | Status: AC
  Filled 2016-01-19: qty 100

## 2016-01-19 MED ORDER — DEXTROSE 5 % IV SOLN
1.0000 g | INTRAVENOUS | Status: AC
  Administered 2016-01-19: 1 g via INTRAVENOUS
  Filled 2016-01-19: qty 1

## 2016-01-19 MED ORDER — FENTANYL CITRATE (PF) 100 MCG/2ML IJ SOLN
INTRAMUSCULAR | Status: AC
  Administered 2016-01-19: 25 ug via INTRAVENOUS
  Filled 2016-01-19: qty 2

## 2016-01-19 SURGICAL SUPPLY — 47 items
APPLIER CLIP ROT 10 11.4 M/L (STAPLE) ×3
BAG COUNTER SPONGE EZ (MISCELLANEOUS) IMPLANT
BLADE SURG SZ11 CARB STEEL (BLADE) ×3 IMPLANT
BULB RESERV EVAC DRAIN JP 100C (MISCELLANEOUS) IMPLANT
CANISTER SUCT 1200ML W/VALVE (MISCELLANEOUS) ×3 IMPLANT
CATH CHOLANG 76X19 KUMAR (CATHETERS) ×3 IMPLANT
CHLORAPREP W/TINT 26ML (MISCELLANEOUS) ×3 IMPLANT
CLIP APPLIE ROT 10 11.4 M/L (STAPLE) ×1 IMPLANT
CLOSURE WOUND 1/2 X4 (GAUZE/BANDAGES/DRESSINGS)
CONRAY 60ML FOR OR (MISCELLANEOUS) ×3 IMPLANT
COUNTER SPONGE BAG EZ (MISCELLANEOUS)
DISSECTOR KITTNER STICK (MISCELLANEOUS) IMPLANT
DISSECTORS/KITTNER STICK (MISCELLANEOUS)
DRAIN CHANNEL JP 19F (MISCELLANEOUS) IMPLANT
DRAPE SHEET LG 3/4 BI-LAMINATE (DRAPES) IMPLANT
DRSG TEGADERM 2-3/8X2-3/4 SM (GAUZE/BANDAGES/DRESSINGS) ×12 IMPLANT
DRSG TELFA 3X8 NADH (GAUZE/BANDAGES/DRESSINGS) ×3 IMPLANT
ELECT REM PT RETURN 9FT ADLT (ELECTROSURGICAL) ×3
ELECTRODE REM PT RTRN 9FT ADLT (ELECTROSURGICAL) ×1 IMPLANT
GAUZE SPONGE 4X4 12PLY STRL (GAUZE/BANDAGES/DRESSINGS) ×3 IMPLANT
GLOVE BIO SURGEON STRL SZ7.5 (GLOVE) ×12 IMPLANT
GLOVE INDICATOR 8.0 STRL GRN (GLOVE) ×3 IMPLANT
GOWN STRL REUS W/ TWL LRG LVL3 (GOWN DISPOSABLE) ×3 IMPLANT
GOWN STRL REUS W/TWL LRG LVL3 (GOWN DISPOSABLE) ×6
IRRIGATION STRYKERFLOW (MISCELLANEOUS) IMPLANT
IRRIGATOR STRYKERFLOW (MISCELLANEOUS)
IV NS 1000ML (IV SOLUTION)
IV NS 1000ML BAXH (IV SOLUTION) IMPLANT
L-HOOK LAP DISP 36CM (ELECTROSURGICAL) ×3
LABEL OR SOLS (LABEL) ×3 IMPLANT
LHOOK LAP DISP 36CM (ELECTROSURGICAL) ×1 IMPLANT
NEEDLE HYPO 25X1 1.5 SAFETY (NEEDLE) ×3 IMPLANT
NEEDLE VERESS 14GA 120MM (NEEDLE) ×3 IMPLANT
NS IRRIG 500ML POUR BTL (IV SOLUTION) ×3 IMPLANT
PACK LAP CHOLECYSTECTOMY (MISCELLANEOUS) ×3 IMPLANT
PENCIL ELECTRO HAND CTR (MISCELLANEOUS) ×3 IMPLANT
POUCH ENDO CATCH 10MM SPEC (MISCELLANEOUS) ×3 IMPLANT
SCISSORS METZENBAUM CVD 33 (INSTRUMENTS) ×3 IMPLANT
SLEEVE ENDOPATH XCEL 5M (ENDOMECHANICALS) ×6 IMPLANT
STRIP CLOSURE SKIN 1/2X4 (GAUZE/BANDAGES/DRESSINGS) IMPLANT
SUT MNCRL 4-0 (SUTURE) ×2
SUT MNCRL 4-0 27XMFL (SUTURE) ×1
SUTURE MNCRL 4-0 27XMF (SUTURE) ×1 IMPLANT
SWABSTK COMLB BENZOIN TINCTURE (MISCELLANEOUS) ×3 IMPLANT
TROCAR XCEL 12X100 BLDLESS (ENDOMECHANICALS) ×3 IMPLANT
TROCAR XCEL NON-BLD 5MMX100MML (ENDOMECHANICALS) ×3 IMPLANT
TUBING INSUFFLATOR HI FLOW (MISCELLANEOUS) ×3 IMPLANT

## 2016-01-19 NOTE — Interval H&P Note (Signed)
History and Physical Interval Note:  01/19/2016 8:25 AM  Diana Wilkins  has presented today for surgery, with the diagnosis of gallstones,pancreatitis  The various methods of treatment have been discussed with the patient and family. After consideration of risks, benefits and other options for treatment, the patient has consented to  Procedure(s): LAPAROSCOPIC CHOLECYSTECTOMY WITH INTRAOPERATIVE CHOLANGIOGRAM (N/A) as a surgical intervention .  The patient's history has been reviewed, patient examined, no change in status, stable for surgery.  I have reviewed the patient's chart and labs.  Questions were answered to the patient's satisfaction.     Clayburn Pert

## 2016-01-19 NOTE — Anesthesia Preprocedure Evaluation (Addendum)
Anesthesia Evaluation  Patient identified by MRN, date of birth, ID band Patient awake    Reviewed: Allergy & Precautions, NPO status , Patient's Chart, lab work & pertinent test results, reviewed documented beta blocker date and time   Airway Mallampati: II  TM Distance: >3 FB     Dental  (+) Chipped, Upper Dentures, Partial Lower   Pulmonary Current Smoker,           Cardiovascular hypertension, Pt. on medications      Neuro/Psych  Headaches,    GI/Hepatic GERD  Controlled,  Endo/Other    Renal/GU      Musculoskeletal   Abdominal   Peds  Hematology   Anesthesia Other Findings CXR OK. EKG OK.  Reproductive/Obstetrics                           Anesthesia Physical Anesthesia Plan  ASA: II  Anesthesia Plan: General   Post-op Pain Management:    Induction: Intravenous  Airway Management Planned: Oral ETT  Additional Equipment:   Intra-op Plan:   Post-operative Plan:   Informed Consent: I have reviewed the patients History and Physical, chart, labs and discussed the procedure including the risks, benefits and alternatives for the proposed anesthesia with the patient or authorized representative who has indicated his/her understanding and acceptance.     Plan Discussed with: CRNA  Anesthesia Plan Comments:         Anesthesia Quick Evaluation

## 2016-01-19 NOTE — H&P (View-Only) (Signed)
Patient ID: Diana Wilkins, female   DOB: 03/02/1956, 60 y.o.   MRN: [id number]  CC: ABDOMINAL PAIN  HPI Diana Wilkins is a 60 y.o. female who presents to clinic today for evaluation of abdominal pain. Patient states she was in the emergency department and urgent cares numerous times in the last several weeks and was found to have a mild pancreatitis and cholelithiasis. This was suggestive of gallstone pancreatitis. She states that the pain wrapped around her entire upper abdomen and felt like a hot fire poker stabbing into her right upper quadrant at times. This was worsened by eating and relieved by pain medications. She also was thought to have a urinary tract infection that time. Was treated with antibiotics. She states the pain has somewhat subsided but she has been afraid to eat and has had decreased oral intake. She's also had intermittent nausea and vomiting but none in the last several days. She denies any fevers, chills, chest pain, shortness breath, diarrhea, constipation.  HPI  Past Medical History  Diagnosis Date  . GERD (gastroesophageal reflux disease)   . Hypertension   . Hyperlipidemia   . Stress incontinence     Past Surgical History  Procedure Laterality Date  . Tubal ligation    . Colonoscopy  09/07/2006  . Tonsillectomy  1970  . Esophagogastroduodenoscopy  07/21/2014    History reviewed. No pertinent family history.  Social History Social History  Substance Use Topics  . Smoking status: Current Every Day Smoker -- 0.50 packs/day for 41 years    Types: Cigarettes  . Smokeless tobacco: Never Used  . Alcohol Use: No    Allergies  Allergen Reactions  . Amoxicillin-Pot Clavulanate Nausea Only    Current Outpatient Prescriptions  Medication Sig Dispense Refill  . Cholecalciferol (VITAMIN D-3) 1000 units CAPS Take 1 capsule by mouth daily.    Marland Kitchen docusate sodium (COLACE) 100 MG capsule Take 1 tablet once or twice daily as needed for constipation while  taking narcotic pain medicine 30 capsule 0  . nitrofurantoin, macrocrystal-monohydrate, (MACROBID) 100 MG capsule Take 1 capsule by mouth 2 (two) times daily.    Marland Kitchen omeprazole (PRILOSEC) 40 MG capsule Take 40 mg by mouth daily.    . ondansetron (ZOFRAN ODT) 4 MG disintegrating tablet Allow 1-2 tablets to dissolve in your mouth every 8 hours as needed for nausea/vomiting 30 tablet 0  . oxybutynin (DITROPAN-XL) 5 MG 24 hr tablet Take 5 mg by mouth 2 (two) times daily.    Marland Kitchen oxyCODONE-acetaminophen (ROXICET) 5-325 MG tablet Take 1-2 tablets by mouth every 4 (four) hours as needed for severe pain. 30 tablet 0  . phenazopyridine (PYRIDIUM) 200 MG tablet Take 1 tablet by mouth daily.  0   No current facility-administered medications for this visit.     Review of Systems A Multi-point review of systems was asked and was negative except for the findings documented in the history of present illness  Physical Exam Blood pressure 163/70, pulse 86, temperature 98.7 F (37.1 C), temperature source Oral, weight 80.74 kg (178 lb). CONSTITUTIONAL: No acute distress. EYES: Pupils are equal, round, and reactive to light, Sclera are non-icteric. EARS, NOSE, MOUTH AND THROAT: The oropharynx is clear. The oral mucosa is pink and moist. Hearing is intact to voice. LYMPH NODES:  Lymph nodes in the neck are normal. RESPIRATORY:  Lungs are clear. There is normal respiratory effort, with equal breath sounds bilaterally, and without pathologic use of accessory muscles. CARDIOVASCULAR: Heart is regular without murmurs,  gallops, or rubs. GI: The abdomen is  soft, nontender, and nondistended. There are no palpable masses. There is no hepatosplenomegaly. There are normal bowel sounds in all quadrants. GU: Rectal deferred.   MUSCULOSKELETAL: Normal muscle strength and tone. No cyanosis or edema.   SKIN: Turgor is good and there are no pathologic skin lesions or ulcers. NEUROLOGIC: Motor and sensation is grossly normal.  Cranial nerves are grossly intact. PSYCH:  Oriented to person, place and time. Affect is normal.  Data Reviewed Images and labs reviewed, patient had a mild leukocytosis of 12.3, a mild pancreatitis with a lipase of 237 on ER visit 1 week ago. Ultrasound showing cholelithiasis without evidence of gallbladder wall thickening, pericholecystic fluid, ductal dilatation. I have personally reviewed the patient's imaging, laboratory findings and medical records.    Assessment    Gallstone pancreatitis    Plan    A long conversation with the patient about the diagnosis of gallstone pancreatitis. Patient is not a drinker making her small gallstones seen on ultrasound the most likely cause of her mild pancreatitis. Discussed the limited to decrease the chance of this happening again we remove her gallbladder. I discussed the procedure of a laparoscopic cholecystectomy with intraoperative cholangiogram in detail.  The patient was given Neurosurgeon.  We discussed the risks and benefits of a laparoscopic cholecystectomy and possible cholangiogram including, but not limited to bleeding, infection, injury to surrounding structures such as the intestine or liver, bile leak, retained gallstones, need to convert to an open procedure, prolonged diarrhea, blood clots such as  DVT, common bile duct injury, anesthesia risks, and possible need for additional procedures.  The likelihood of improvement in symptoms and return to the patient's normal status is good. We discussed the typical post-operative recovery course. Patient voiced understanding and wishes to proceed. We will plan for her laparoscopic cholecystectomy on Tuesday, May 23.      Time spent with the patient was 55 minutes, with more than 50% of the time spent in face-to-face education, counseling and care coordination.     Clayburn Pert, MD FACS General Surgeon 01/05/2016, 11:19 AM

## 2016-01-19 NOTE — Transfer of Care (Signed)
Immediate Anesthesia Transfer of Care Note  Patient: Diana Wilkins  Procedure(s) Performed: Procedure(s): LAPAROSCOPIC CHOLECYSTECTOMY WITH INTRAOPERATIVE CHOLANGIOGRAM (N/A)  Patient Location: PACU  Anesthesia Type:General  Level of Consciousness: awake, alert  and oriented  Airway & Oxygen Therapy: Patient Spontanous Breathing and Patient connected to face mask oxygen  Post-op Assessment: Report given to RN and Post -op Vital signs reviewed and stable  Post vital signs: Reviewed and stable  Last Vitals:  Filed Vitals:   01/19/16 0814 01/19/16 1032  BP: 129/67 154/80  Pulse: 89 88  Temp: 36.8 C 36.6 C  Resp: 16 18    Last Pain:  Filed Vitals:   01/19/16 1034  PainSc: 9          Complications: No apparent anesthesia complications

## 2016-01-19 NOTE — Brief Op Note (Signed)
01/19/2016  10:18 AM  PATIENT:  Diana Wilkins  60 y.o. female  PRE-OPERATIVE DIAGNOSIS:  gallstones,pancreatitis  POST-OPERATIVE DIAGNOSIS:  gallstones,pancreatitis  PROCEDURE:  Procedure(s): LAPAROSCOPIC CHOLECYSTECTOMY WITH INTRAOPERATIVE CHOLANGIOGRAM (N/A)  SURGEON:  Surgeon(s) and Role:    * Clayburn Pert, MD - Primary  PHYSICIAN ASSISTANT:   ASSISTANTS: none   ANESTHESIA:   general  EBL:     BLOOD ADMINISTERED:none  DRAINS: none   LOCAL MEDICATIONS USED:  MARCAINE   , XYLOCAINE  and Amount: 29 ml  SPECIMEN:  Source of Specimen:  gallbladder  DISPOSITION OF SPECIMEN:  PATHOLOGY  COUNTS:  YES  TOURNIQUET:  * No tourniquets in log *  DICTATION: .Dragon Dictation  PLAN OF CARE: Discharge to home after PACU  PATIENT DISPOSITION:  PACU - hemodynamically stable.   Delay start of Pharmacological VTE agent (>24hrs) due to surgical blood loss or risk of bleeding: not applicable

## 2016-01-19 NOTE — Op Note (Signed)
Laparoscopic Cholecystectomy  Pre-operative Diagnosis: History of gallstone pancreatitis  Post-operative Diagnosis: Same  Procedure: Laparoscopic cholecystectomy with intraoperative cholangiogram  Surgeon: Juanda Crumble T. Adonis Huguenin, MD FACS  Anesthesia: Gen. with endotracheal tube  Assistant: None  Procedure Details  The patient was seen again in the Holding Room. The benefits, complications, treatment options, and expected outcomes were discussed with the patient. The risks of bleeding, infection, recurrence of symptoms, failure to resolve symptoms, bile duct damage, bile duct leak, retained common bile duct stone, bowel injury, any of which could require further surgery and/or ERCP, stent, or papillotomy were reviewed with the patient. The likelihood of improving the patient's symptoms with return to their baseline status is good.  The patient and/or family concurred with the proposed plan, giving informed consent.  The patient was taken to Operating Room, identified as Diana Wilkins and the procedure verified as Laparoscopic Cholecystectomy.  A Time Out was held and the above information confirmed.  Prior to the induction of general anesthesia, antibiotic prophylaxis was administered. VTE prophylaxis was in place. General endotracheal anesthesia was then administered and tolerated well. After the induction, the abdomen was prepped with Chloraprep and draped in the sterile fashion. The patient was positioned in the supine position.  Local anesthetic  was injected into the skin in the right upper quadrant 2 finger roots below the costal margin in the midclavicular line and an incision made. The Veress needle was placed. Pneumoperitoneum was then created with CO2 and tolerated well without any adverse changes in the patient's vital signs. A 105mm port was placed through that incision and the abdominal cavity was explored.  Two 5-mm ports were placed, one in the periumbilical region and one in the right  upper quadrant and a 12 mm epigastric port was placed all under direct vision. All skin incisions  were infiltrated with a local anesthetic agent before making the incision and placing the trocars.   The patient was positioned  in reverse Trendelenburg, tilted slightly to the patient's left.  The gallbladder was identified, the fundus grasped and retracted cephalad. Adhesions were lysed bluntly. The infundibulum was grasped and retracted laterally, exposing the peritoneum overlying the triangle of Calot. This was then divided and exposed in a blunt fashion. A critical view of the cystic duct and cystic artery was obtained.  The cystic duct was clearly identified and bluntly dissected. During this process the posterior wall of the gallbladder was incidentally entered into spilling bile into the abdominal cavity. This was controlled with a suction irrigator.  Due to the history of gallstone pancreatitis a cholangiogram was performed. Using a Kumar clamp and catheter under fluoroscopy contrast was instilled into the infundibulum of the gallbladder which filled the base of the gallbladder, cystic duct, common bile duct, hepatic duct, right and left, and was visualized going into the duodenum. There was no obvious filling defect to indicate a retained stone. At which point the duct and artery were again circumflex marginal cleared off with blunt dissection and serially clipped with endoclips prior to being cut with endoscopic shears.  The gallbladder was taken from the gallbladder fossa in a retrograde fashion with the electrocautery. The gallbladder was removed and placed in an Endocatch bag. The liver bed was irrigated and inspected. Hemostasis was achieved with the electrocautery. Copious irrigation was utilized and was repeatedly aspirated until clear.  The gallbladder and Endocatch sac were then removed through the epigastric port site.    Inspection of the right upper quadrant was performed. No bleeding,  bile duct injury or leak, or bowel injury was noted. Pneumoperitoneum was released.  4-0 subcuticular Monocryl was used to close the skin. Steristrips and Mastisol and sterile dressings were  applied.  The patient was then extubated and brought to the recovery room in stable condition. Sponge, lap, and needle counts were correct at closure and at the conclusion of the case.   Findings: Previous Cholecystitis   Estimated Blood Loss: 10 mL         Drains: None         Specimens: Gallbladder           Complications: none               Oriel Rumbold T. Adonis Huguenin, MD, FACS

## 2016-01-19 NOTE — Progress Notes (Signed)
Dr. Adonis Huguenin notified of reaction to cipro , itching and rash , Dr. Adonis Huguenin in for eval

## 2016-01-19 NOTE — Discharge Instructions (Signed)
Laparoscopic Cholecystectomy, Care After Refer to this sheet in the next few weeks. These instructions provide you with information about caring for yourself after your procedure. Your health care provider may also give you more specific instructions. Your treatment has been planned according to current medical practices, but problems sometimes occur. Call your health care provider if you have any problems or questions after your procedure. WHAT TO EXPECT AFTER THE PROCEDURE After your procedure, it is common to have:  Pain at your incision sites. You will be given pain medicines to control your pain.  Mild nausea or vomiting. This should improve after the first 24 hours.  Bloating and possible shoulder pain from the gas that was used during the procedure. This will improve after the first 24 hours. HOME CARE INSTRUCTIONS Incision Care  Follow instructions from your health care provider about how to take care of your incisions. Make sure you:  Wash your hands with soap and water before you change your bandage (dressing). If soap and water are not available, use hand sanitizer.  Change your dressing as told by your health care provider.  Leave stitches (sutures), skin glue, or adhesive strips in place. These skin closures may need to be in place for 2 weeks or longer. If adhesive strip edges start to loosen and curl up, you may trim the loose edges. Do not remove adhesive strips completely unless your health care provider tells you to do that.  Do not take baths, swim, or use a hot tub until your health care provider approves. Ask your health care provider if you can take showers. You may only be allowed to take sponge baths for bathing. General Instructions  Take over-the-counter and prescription medicines only as told by your health care provider.  Do not drive or operate heavy machinery while taking prescription pain medicine.  Return to your normal diet as told by your health care  provider.  Do not lift anything that is heavier than 10 lb (4.5 kg).  Do not play contact sports for one week or until your health care provider approves. SEEK MEDICAL CARE IF:   You have redness, swelling, or pain at the site of your incision.  You have fluid, blood, or pus coming from your incision.  You notice a bad smell coming from your incision area.  Your surgical incisions break open.  You have a fever. SEEK IMMEDIATE MEDICAL CARE IF:  You develop a rash.  You have difficulty breathing.  You have chest pain.  You have increasing pain in your shoulders (shoulder strap areas).  You faint or have dizzy episodes while you are standing.  You have severe pain in your abdomen.  You have nausea or vomiting that lasts for more than one day.   This information is not intended to replace advice given to you by your health care provider. Make sure you discuss any questions you have with your health care provider.   Document Released: 08/15/2005 Document Revised: 05/06/2015 Document Reviewed: 03/27/2013 Elsevier Interactive Patient Education 2016 [place]   1) The drugs that you were given will stay in your system until tomorrow so for the next 24 hours you should not:  A) Drive an automobile B) Make any legal decisions C) Drink any alcoholic beverage   2) You may resume regular meals tomorrow.  Today it is better to start with liquids and gradually work up to solid foods.  You may eat anything you  prefer, but it is better to start with liquids, then soup and crackers, and gradually work up to solid foods.   3) Please notify your doctor immediately if you have any unusual bleeding, trouble breathing, redness and pain at the surgery site, drainage, fever, or pain not relieved by medication.    4) Additional Instructions:        Please contact your physician with any problems or Same Day Surgery at  [phone], Monday through Friday 6 am to 4 pm, or  at [facility] number at [phone].

## 2016-01-19 NOTE — Anesthesia Procedure Notes (Signed)
Procedure Name: Intubation Date/Time: 01/19/2016 9:14 AM Performed by: Kennon Holter Pre-anesthesia Checklist: Patient identified, Patient being monitored, Timeout performed, Emergency Drugs available and Suction available Patient Re-evaluated:Patient Re-evaluated prior to inductionOxygen Delivery Method: Circle system utilized Preoxygenation: Pre-oxygenation with 100% oxygen Intubation Type: IV induction Ventilation: Mask ventilation without difficulty Laryngoscope Size: Miller and 2 Grade View: Grade I Tube type: Oral Tube size: 7.0 mm Number of attempts: 1 Airway Equipment and Method: Stylet Placement Confirmation: ETT inserted through vocal cords under direct vision,  positive ETCO2 and breath sounds checked- equal and bilateral Secured at: 21 cm Tube secured with: Tape Dental Injury: Teeth and Oropharynx as per pre-operative assessment

## 2016-01-19 NOTE — Anesthesia Postprocedure Evaluation (Signed)
Anesthesia Post Note  Patient: Diana Wilkins  Procedure(s) Performed: Procedure(s) (LRB): LAPAROSCOPIC CHOLECYSTECTOMY WITH INTRAOPERATIVE CHOLANGIOGRAM (N/A)  Patient location during evaluation: PACU Anesthesia Type: General Level of consciousness: awake and alert Pain management: pain level controlled Vital Signs Assessment: post-procedure vital signs reviewed and stable Respiratory status: spontaneous breathing, nonlabored ventilation, respiratory function stable and patient connected to nasal cannula oxygen Cardiovascular status: blood pressure returned to baseline and stable Postop Assessment: no signs of nausea or vomiting Anesthetic complications: no    Last Vitals:  Filed Vitals:   01/19/16 1134 01/19/16 1204  BP: 127/60 113/53  Pulse: 86 80  Temp: 36.8 C   Resp: 17 16    Last Pain:  Filed Vitals:   01/19/16 1217  PainSc: [place]

## 2016-01-27 ENCOUNTER — Ambulatory Visit (INDEPENDENT_AMBULATORY_CARE_PROVIDER_SITE_OTHER): Payer: BC Managed Care – PPO | Admitting: General Surgery

## 2016-01-27 ENCOUNTER — Encounter: Payer: Self-pay | Admitting: General Surgery

## 2016-01-27 VITALS — BP 148/77 | HR 94 | Temp 98.5°F | Ht 67.0 in | Wt 174.6 lb

## 2016-01-27 DIAGNOSIS — Z4889 Encounter for other specified surgical aftercare: Secondary | ICD-10-CM

## 2016-01-27 MED ORDER — OMEPRAZOLE 40 MG PO CPDR: 40.0000 mg | DELAYED_RELEASE_CAPSULE | Freq: Every day | ORAL | Status: DC

## 2016-01-27 NOTE — Progress Notes (Signed)
Outpatient Surgical Follow Up  01/27/2016  Diana Wilkins is an 60 y.o. female.   Chief Complaint  Patient presents with  . Routine Post Op    Laparoscopy Cholecystitis-(DR. Adonis Huguenin 01/19/16)    HPI: 60 year old female returns to clinic 1 week status post laparoscopic cholecystectomy. Patient reports doing well. She denies any fevers, chills, nausea, vomiting, diarrhea, constipation, chest pain or shortness of breath. She's been very happy with her surgical results and is healing well. She plans to return to work tomorrow  Past Medical History  Diagnosis Date  . GERD (gastroesophageal reflux disease)   . Hypertension   . Hyperlipidemia   . Stress incontinence   . Headache     H/O MIGRAINES    Past Surgical History  Procedure Laterality Date  . Tubal ligation    . Colonoscopy  09/07/2006  . Tonsillectomy  1970  . Esophagogastroduodenoscopy  07/21/2014  . Cholecystectomy N/A 01/19/2016    Procedure: LAPAROSCOPIC CHOLECYSTECTOMY WITH INTRAOPERATIVE CHOLANGIOGRAM;  Surgeon: Clayburn Pert, MD;  Location: [facility] ORS;  Service: General;  Laterality: N/A;    History reviewed. No pertinent family history.  Social History:  reports that she has been smoking Cigarettes.  She has a 20.5 pack-year smoking history. She has never used smokeless tobacco. She reports that she does not drink alcohol or use illicit drugs.  Allergies:  Allergies  Allergen Reactions  . Erythromycin Nausea Only  . Ciprofloxacin In D5w Rash    Medications reviewed.    ROS  A multipoint review of systems was completed. All pertinent positives and negatives are documented in the history of present illness and remainder are negative  BP 148/77 mmHg  Pulse 94  Temp(Src) 98.5 F (36.9 C) (Oral)  Ht 5\' 7"  (1.702 m)  Wt 79.198 kg (174 lb 9.6 oz)  BMI 27.34 kg/m2  Physical Exam Gen.: No acute distress Chest: Clear to auscultation Heart: Regular rhythm Abdomen: Soft, nontender, nondistended. Well  approximated lap scopic incisions without evidence of erythema or drainage. Mild ecchymosis around upper midline incision.    No results found for this or any previous visit (from the past 48 hour(s)). No results found.  Assessment/Plan:  1. Aftercare following surgery 60 year old female status post laparoscopic cholecystectomy. Doing well. Pathology reviewed with the patient. Discussed activity restrictions as well as signs and symptoms of infection and return to clinic should they occur. Patient will follow-up in clinic on an as-needed basis.     Clayburn Pert, MD FACS General Surgeon  01/27/2016,10:49 AM

## 2016-01-27 NOTE — Patient Instructions (Signed)
Please call our office if you have questions or concerns. We have sent your medicine to the [facility] in [place].

## 2016-02-23 ENCOUNTER — Other Ambulatory Visit: Payer: Self-pay | Admitting: General Surgery

## 2016-03-21 LAB — SURGICAL PATHOLOGY

## 2016-04-20 ENCOUNTER — Other Ambulatory Visit: Payer: Self-pay | Admitting: Nurse Practitioner

## 2016-04-20 DIAGNOSIS — Z1231 Encounter for screening mammogram for malignant neoplasm of breast: Secondary | ICD-10-CM

## 2016-04-24 ENCOUNTER — Encounter: Payer: Self-pay | Admitting: Emergency Medicine

## 2016-04-24 ENCOUNTER — Emergency Department: Payer: BC Managed Care – PPO

## 2016-04-24 ENCOUNTER — Emergency Department
Admission: EM | Admit: 2016-04-24 | Discharge: 2016-04-24 | Disposition: A | Discharge status: Home / Self Care | Payer: BC Managed Care – PPO | Attending: Emergency Medicine | Admitting: Emergency Medicine

## 2016-04-24 DIAGNOSIS — R1110 Vomiting, unspecified: Secondary | ICD-10-CM | POA: Insufficient documentation

## 2016-04-24 DIAGNOSIS — Z9049 Acquired absence of other specified parts of digestive tract: Secondary | ICD-10-CM | POA: Insufficient documentation

## 2016-04-24 DIAGNOSIS — R1111 Vomiting without nausea: Secondary | ICD-10-CM

## 2016-04-24 DIAGNOSIS — I10 Essential (primary) hypertension: Secondary | ICD-10-CM | POA: Diagnosis not present

## 2016-04-24 DIAGNOSIS — F17210 Nicotine dependence, cigarettes, uncomplicated: Secondary | ICD-10-CM | POA: Diagnosis not present

## 2016-04-24 LAB — CBC WITH DIFFERENTIAL/PLATELET
BASOS ABS: 0.1 10*3/uL (ref 0–0.1)
Basophils Relative: 0 %
EOS PCT: 1 %
Eosinophils Absolute: 0.2 10*3/uL (ref 0–0.7)
HEMATOCRIT: 42.5 % (ref 35.0–47.0)
HEMOGLOBIN: 14.7 g/dL (ref 12.0–16.0)
LYMPHS ABS: 1.7 10*3/uL (ref 1.0–3.6)
LYMPHS PCT: 9 %
MCH: 31.3 pg (ref 26.0–34.0)
MCHC: 34.6 g/dL (ref 32.0–36.0)
MCV: 90.4 fL (ref 80.0–100.0)
Monocytes Absolute: 0.7 10*3/uL (ref 0.2–0.9)
Monocytes Relative: 3 %
NEUTROS ABS: 17.8 10*3/uL — AB (ref 1.4–6.5)
NEUTROS PCT: 87 %
Platelets: 412 10*3/uL (ref 150–440)
RBC: 4.7 MIL/uL (ref 3.80–5.20)
RDW: 14.3 % (ref 11.5–14.5)
WBC: 20.4 10*3/uL — AB (ref 3.6–11.0)

## 2016-04-24 LAB — URINALYSIS COMPLETE WITH MICROSCOPIC (ARMC ONLY)
BILIRUBIN URINE: NEGATIVE
GLUCOSE, UA: NEGATIVE mg/dL
KETONES UR: NEGATIVE mg/dL
LEUKOCYTES UA: NEGATIVE
NITRITE: POSITIVE — AB
PH: 6 (ref 5.0–8.0)
Protein, ur: NEGATIVE mg/dL
SPECIFIC GRAVITY, URINE: 1.008 (ref 1.005–1.030)

## 2016-04-24 LAB — COMPREHENSIVE METABOLIC PANEL
ALBUMIN: 4.2 g/dL (ref 3.5–5.0)
ALK PHOS: 98 U/L (ref 38–126)
ALT: 15 U/L (ref 14–54)
ANION GAP: 10 (ref 5–15)
AST: 18 U/L (ref 15–41)
BUN: 15 mg/dL (ref 6–20)
CHLORIDE: 104 mmol/L (ref 101–111)
CO2: 24 mmol/L (ref 22–32)
Calcium: 9.1 mg/dL (ref 8.9–10.3)
Creatinine, Ser: 0.66 mg/dL (ref 0.44–1.00)
GFR calc non Af Amer: >60 mL/min (ref >60)
Glucose, Bld: 155 mg/dL — ABNORMAL HIGH (ref 65–99)
POTASSIUM: 4 mmol/L (ref 3.5–5.1)
SODIUM: 138 mmol/L (ref 135–145)
TOTAL PROTEIN: 7.5 g/dL (ref 6.5–8.1)
Total Bilirubin: 0.8 mg/dL (ref 0.3–1.2)

## 2016-04-24 LAB — TROPONIN I: Troponin I: <0.03 ng/mL (ref <0.03)

## 2016-04-24 LAB — LIPASE, BLOOD: Lipase: 28 U/L (ref 11–51)

## 2016-04-24 MED ORDER — SODIUM CHLORIDE 0.9 % IV SOLN
Freq: Once | INTRAVENOUS | Status: AC
  Administered 2016-04-24: 10:00:00 via INTRAVENOUS

## 2016-04-24 MED ORDER — DIATRIZOATE MEGLUMINE & SODIUM 66-10 % PO SOLN
15.0000 mL | Freq: Once | ORAL | Status: AC
  Administered 2016-04-24: 15 mL via ORAL

## 2016-04-24 MED ORDER — SODIUM CHLORIDE 0.9 % IV BOLUS (SEPSIS)
1000.0000 mL | Freq: Once | INTRAVENOUS | Status: AC
  Administered 2016-04-24: 1000 mL via INTRAVENOUS

## 2016-04-24 MED ORDER — ONDANSETRON HCL 4 MG/2ML IJ SOLN
4.0000 mg | Freq: Once | INTRAMUSCULAR | Status: AC
  Administered 2016-04-24: 4 mg via INTRAVENOUS

## 2016-04-24 MED ORDER — IOPAMIDOL (ISOVUE-300) INJECTION 61%
100.0000 mL | Freq: Once | INTRAVENOUS | Status: AC | PRN
  Administered 2016-04-24: 100 mL via INTRAVENOUS

## 2016-04-24 MED ORDER — ONDANSETRON HCL 4 MG/2ML IJ SOLN
INTRAMUSCULAR | Status: AC
  Administered 2016-04-24: 4 mg via INTRAVENOUS
  Filled 2016-04-24: qty 2

## 2016-04-24 NOTE — ED Triage Notes (Signed)
C/O vomiting since last night.

## 2016-04-24 NOTE — Discharge Instructions (Signed)
Drink clear liquids in small amounts frequently for few hours and try the Brat diet bananas rice applesauce toast and crackers etc. Probably give you eat small amounts of regular food for supper. Return here if you're running a fever or you have return of vomiting or feeling sicker. I will give you a prescription for Zofran melt on your tongue pills you can use one 3 times a day as needed.

## 2016-04-24 NOTE — ED Provider Notes (Signed)
[facility] Emergency Department Provider Note   ____________________________________________   First MD Initiated Contact with Patient 04/24/16 [phone]     (approximate)  I have reviewed the triage vital signs and the nursing notes.   HISTORY  Chief Complaint Emesis   HPI Diana Wilkins is a 60 y.o. female patient began vomiting early this morning. He is continuing to vomit. Reports she developed a headache after vomiting which is not severe. Her belly is not really hurting. She has no other pain elsewhere. No other medical problems that she remembers at present. No one else sick at home. No diarrhea.   Past Medical History:  Diagnosis Date  . GERD (gastroesophageal reflux disease)   . Headache    H/O MIGRAINES  . Hyperlipidemia   . Hypertension   . Stress incontinence     Patient Active Problem List   Diagnosis Date Noted  . Cholecystitis   . Gallstone pancreatitis 01/05/2016  . Blood pressure elevated 01/04/2016  . Acid reflux 01/04/2016  . HLD (hyperlipidemia) 01/04/2016  . Avitaminosis D 01/04/2016  . Stress incontinence 01/04/2016  . Vitamin D deficiency 01/04/2016    Past Surgical History:  Procedure Laterality Date  . CHOLECYSTECTOMY N/A 01/19/2016   Procedure: LAPAROSCOPIC CHOLECYSTECTOMY WITH INTRAOPERATIVE CHOLANGIOGRAM;  Surgeon: Clayburn Pert, MD;  Location: [facility] ORS;  Service: General;  Laterality: N/A;  . COLONOSCOPY  09/07/2006  . ESOPHAGOGASTRODUODENOSCOPY  07/21/2014  . TONSILLECTOMY  1970  . TUBAL LIGATION      Prior to Admission medications   Medication Sig Start Date End Date Taking? Authorizing Provider  Cholecalciferol (VITAMIN D-3) 1000 units CAPS Take 1 capsule by mouth daily.    Historical Provider, MD  docusate sodium (COLACE) 100 MG capsule Take 1 tablet once or twice daily as needed for constipation while taking narcotic pain medicine 12/29/15   Hinda Kehr, MD  omeprazole (PRILOSEC) 40 MG capsule Take 40 mg  by mouth 2 (two) times daily.     Historical Provider, MD  omeprazole (PRILOSEC) 40 MG capsule Take 1 capsule (40 mg total) by mouth daily. 01/27/16   Clayburn Pert, MD  ondansetron (ZOFRAN ODT) 4 MG disintegrating tablet Allow 1-2 tablets to dissolve in your mouth every 8 hours as needed for nausea/vomiting 12/29/15   Hinda Kehr, MD  oxybutynin (DITROPAN-XL) 5 MG 24 hr tablet Take 5 mg by mouth 2 (two) times daily.    Historical Provider, MD    Allergies Erythromycin and Ciprofloxacin in d5w  No family history on file.  Social History Social History  Substance Use Topics  . Smoking status: Current Some Day Smoker    Packs/day: 0.50    Years: 41.00    Types: Cigarettes  . Smokeless tobacco: Never Used  . Alcohol use No    Review of Systems Constitutional: No fever/chills Eyes: No visual changes. ENT: No sore throat. Cardiovascular: Denies chest pain. Respiratory: Denies shortness of breath. Gastrointestinal: No abdominal pain. nausea,  vomiting.  No diarrhea.  No constipation. Genitourinary: Negative for dysuria. Musculoskeletal: Negative for back pain. Skin: Negative for rash. Neurological: Negative for headaches, focal weakness or numbness.  10-point ROS otherwise negative.  ____________________________________________   PHYSICAL EXAM:  VITAL SIGNS: ED Triage Vitals  Enc Vitals Group     BP 04/24/16 0945 140/62     Pulse Rate 04/24/16 0945 82     Resp 04/24/16 0945 18     Temp 04/24/16 0945 97.4 F (36.3 C)     Temp Source 04/24/16  0945 Oral     SpO2 04/24/16 0945 98 %     Weight 04/24/16 0944 170 lb (77.1 kg)     Height 04/24/16 0944 5\' 5"  (1.651 m)     Head Circumference --      Peak Flow --      Pain Score 04/24/16 0945 0     Pain Loc --      Pain Edu? --      Excl. in [place]? --     Constitutional: Alert and oriented. Well appearing and in no acute distress. Eyes: Conjunctivae are normal. PERRL. EOMI. Head: Atraumatic. Nose: No  congestion/rhinnorhea. Mouth/Throat: Mucous membranes are moist.  Oropharynx non-erythematous. Neck: No stridor Cardiovascular: Normal rate, regular rhythm. Grossly normal heart sounds.  Good peripheral circulation. Respiratory: Normal respiratory effort.  No retractions. Lungs CTAB. Gastrointestinal: Soft and nontender. No distention. No abdominal bruits. No CVA tenderness. Musculoskeletal: No lower extremity tenderness nor edema.  No joint effusions.   ____________________________________________   LABS (all labs ordered are listed, but only abnormal results are displayed)  Labs Reviewed  COMPREHENSIVE METABOLIC PANEL  CBC WITH DIFFERENTIAL/PLATELET  TROPONIN I  LIPASE, BLOOD   ____________________________________________  EKG  EKG read and interpreted by me shows normal sinus rhythm rate of 74 normal axis no acute ST-T wave changes ____________________________________________  RADIOLOGY  ABDOMEN ACUTE W/ 1V CHEST  COMPARISON:  Chest radiograph 01/13/2016  FINDINGS: There is no evidence of dilated bowel loops or free intraperitoneal air. Paucity of small bowel gas. Moderate amount of stool throughout the colon. No radiopaque calculi or other significant radiographic abnormality is seen.  Heart size and mediastinal contours are within normal limits. Calcific atherosclerotic disease of the aorta. Both lungs are clear.  IMPRESSION: Nonobstructive bowel gas pattern with paucity of small bowel gas.  No acute cardiopulmonary disease.   Electronically Signed   By: Fidela Salisbury M.D.   On: 04/24/2016 10:31  ________Study Result   CLINICAL DATA:  Vomiting, nausea and diarrhea since last night.  EXAM: CT ABDOMEN AND PELVIS WITH CONTRAST  TECHNIQUE: Multidetector CT imaging of the abdomen and pelvis was performed using the standard protocol following bolus administration of intravenous contrast.  CONTRAST:  111mL ISOVUE-300 IOPAMIDOL  (ISOVUE-300) INJECTION 61%  COMPARISON:  Radiograph 04/24/16  FINDINGS: Lower chest: Lung bases are clear.  Hepatobiliary: No focal hepatic lesion. Postcholecystectomy. No biliary dilatation.  Pancreas: Pancreas is normal. No ductal dilatation. No pancreatic inflammation.  Spleen: Normal spleen  Adrenals/urinary tract: Adrenal glands and kidneys are normal. The ureters and bladder normal.  Stomach/Bowel: Stomach, small bowel, appendix, and cecum are normal. The colon and rectosigmoid colon are normal.  Vascular/Lymphatic: Abdominal aorta is normal caliber with atherosclerotic calcification. There is no retroperitoneal or periportal lymphadenopathy. No pelvic lymphadenopathy.  Reproductive: Uterus and ovaries normal.  Other: No free fluid.  Musculoskeletal: No aggressive osseous lesion.  IMPRESSION: 1. No acute abdominal pelvic findings. 2. Normal appendix. 3. Postcholecystectomy. 4.  Atherosclerotic calcification of the abdominal aorta.   Electronically Signed   By: Suzy Bouchard M.D.   On: 04/24/2016 13:50   ____________________________________   PROCEDURES  Procedure(s) performed:   Procedures  Critical Care performed:   ____________________________________________   INITIAL IMPRESSION / ASSESSMENT AND PLAN / ED COURSE  Pertinent labs & imaging results that were available during my care of the patient were reviewed by me and considered in my medical decision making (see chart for details).    Clinical Course     ____________________________________________   FINAL  CLINICAL IMPRESSION(S) / ED DIAGNOSES  Final diagnoses:  None      NEW MEDICATIONS STARTED DURING THIS VISIT:  New Prescriptions   No medications on file     Note:  This document was prepared using Dragon voice recognition software and may include unintentional dictation errors.    Nena Polio, MD 04/24/16 [phone]

## 2016-04-24 NOTE — ED Notes (Signed)
Pt given ginger ale. Tolerating it well.

## 2016-05-12 ENCOUNTER — Ambulatory Visit
Admission: RE | Admit: 2016-05-12 | Discharge: 2016-05-12 | Disposition: A | Discharge status: Home / Self Care | Payer: BC Managed Care – PPO | Source: Ambulatory Visit | Attending: Nurse Practitioner | Admitting: Nurse Practitioner

## 2016-05-12 ENCOUNTER — Other Ambulatory Visit: Payer: Self-pay | Admitting: Nurse Practitioner

## 2016-05-12 DIAGNOSIS — Z1231 Encounter for screening mammogram for malignant neoplasm of breast: Secondary | ICD-10-CM

## 2017-04-21 ENCOUNTER — Other Ambulatory Visit: Payer: Self-pay | Admitting: Nurse Practitioner

## 2017-04-21 DIAGNOSIS — Z1231 Encounter for screening mammogram for malignant neoplasm of breast: Secondary | ICD-10-CM

## 2017-06-07 ENCOUNTER — Other Ambulatory Visit: Payer: Self-pay | Admitting: Nurse Practitioner

## 2017-06-07 ENCOUNTER — Ambulatory Visit
Admission: RE | Admit: 2017-06-07 | Discharge: 2017-06-07 | Disposition: A | Discharge status: Home / Self Care | Payer: BC Managed Care – PPO | Source: Ambulatory Visit | Attending: Nurse Practitioner | Admitting: Nurse Practitioner

## 2017-06-07 DIAGNOSIS — Z1231 Encounter for screening mammogram for malignant neoplasm of breast: Secondary | ICD-10-CM

## 2017-10-27 ENCOUNTER — Other Ambulatory Visit: Payer: Self-pay | Admitting: Gastroenterology

## 2017-10-27 DIAGNOSIS — R1310 Dysphagia, unspecified: Secondary | ICD-10-CM

## 2017-11-01 ENCOUNTER — Ambulatory Visit
Admission: RE | Admit: 2017-11-01 | Discharge: 2017-11-01 | Disposition: A | Discharge status: Home / Self Care | Payer: BC Managed Care – PPO | Source: Ambulatory Visit | Attending: Gastroenterology | Admitting: Gastroenterology

## 2017-11-01 DIAGNOSIS — K219 Gastro-esophageal reflux disease without esophagitis: Secondary | ICD-10-CM | POA: Diagnosis not present

## 2017-11-01 DIAGNOSIS — K449 Diaphragmatic hernia without obstruction or gangrene: Secondary | ICD-10-CM | POA: Diagnosis not present

## 2017-11-01 DIAGNOSIS — R1310 Dysphagia, unspecified: Secondary | ICD-10-CM | POA: Diagnosis not present

## 2017-12-26 ENCOUNTER — Ambulatory Visit
Admission: RE | Admit: 2017-12-26 | Discharge: 2017-12-26 | Disposition: A | Discharge status: Home / Self Care | Payer: BC Managed Care – PPO | Source: Ambulatory Visit | Attending: Gastroenterology | Admitting: Gastroenterology

## 2017-12-26 ENCOUNTER — Ambulatory Visit: Payer: BC Managed Care – PPO | Admitting: Anesthesiology

## 2017-12-26 ENCOUNTER — Encounter
Admission: RE | Disposition: A | Discharge status: Home / Self Care | Payer: Self-pay | Source: Ambulatory Visit | Attending: Gastroenterology

## 2017-12-26 ENCOUNTER — Encounter: Payer: Self-pay | Admitting: Anesthesiology

## 2017-12-26 DIAGNOSIS — K222 Esophageal obstruction: Secondary | ICD-10-CM | POA: Insufficient documentation

## 2017-12-26 DIAGNOSIS — R1013 Epigastric pain: Secondary | ICD-10-CM | POA: Insufficient documentation

## 2017-12-26 DIAGNOSIS — Z1211 Encounter for screening for malignant neoplasm of colon: Secondary | ICD-10-CM | POA: Diagnosis present

## 2017-12-26 DIAGNOSIS — K635 Polyp of colon: Secondary | ICD-10-CM | POA: Insufficient documentation

## 2017-12-26 DIAGNOSIS — K210 Gastro-esophageal reflux disease with esophagitis: Secondary | ICD-10-CM | POA: Insufficient documentation

## 2017-12-26 DIAGNOSIS — K5730 Diverticulosis of large intestine without perforation or abscess without bleeding: Secondary | ICD-10-CM | POA: Insufficient documentation

## 2017-12-26 DIAGNOSIS — K2970 Gastritis, unspecified, without bleeding: Secondary | ICD-10-CM | POA: Insufficient documentation

## 2017-12-26 DIAGNOSIS — D175 Benign lipomatous neoplasm of intra-abdominal organs: Secondary | ICD-10-CM | POA: Insufficient documentation

## 2017-12-26 DIAGNOSIS — K621 Rectal polyp: Secondary | ICD-10-CM | POA: Diagnosis not present

## 2017-12-26 DIAGNOSIS — Z79899 Other long term (current) drug therapy: Secondary | ICD-10-CM | POA: Insufficient documentation

## 2017-12-26 DIAGNOSIS — K3189 Other diseases of stomach and duodenum: Secondary | ICD-10-CM | POA: Insufficient documentation

## 2017-12-26 DIAGNOSIS — I10 Essential (primary) hypertension: Secondary | ICD-10-CM | POA: Insufficient documentation

## 2017-12-26 DIAGNOSIS — R1310 Dysphagia, unspecified: Secondary | ICD-10-CM | POA: Insufficient documentation

## 2017-12-26 DIAGNOSIS — Z9049 Acquired absence of other specified parts of digestive tract: Secondary | ICD-10-CM | POA: Diagnosis not present

## 2017-12-26 DIAGNOSIS — Z881 Allergy status to other antibiotic agents status: Secondary | ICD-10-CM | POA: Insufficient documentation

## 2017-12-26 HISTORY — PX: COLONOSCOPY WITH PROPOFOL: SHX5780

## 2017-12-26 HISTORY — PX: ESOPHAGOGASTRODUODENOSCOPY (EGD) WITH PROPOFOL: SHX5813

## 2017-12-26 SURGERY — ESOPHAGOGASTRODUODENOSCOPY (EGD) WITH PROPOFOL
Anesthesia: General

## 2017-12-26 MED ORDER — LIDOCAINE HCL (CARDIAC) PF 100 MG/5ML IV SOSY
PREFILLED_SYRINGE | INTRAVENOUS | Status: DC | PRN
  Administered 2017-12-26: 50 mg via INTRAVENOUS

## 2017-12-26 MED ORDER — SODIUM CHLORIDE 0.9 % IV SOLN: INTRAVENOUS | Status: DC

## 2017-12-26 MED ORDER — PROPOFOL 500 MG/50ML IV EMUL
INTRAVENOUS | Status: AC
  Filled 2017-12-26: qty 50

## 2017-12-26 MED ORDER — SODIUM CHLORIDE 0.9 % IV SOLN
INTRAVENOUS | Status: DC
  Administered 2017-12-26: 1000 mL via INTRAVENOUS

## 2017-12-26 MED ORDER — PROPOFOL 10 MG/ML IV BOLUS
INTRAVENOUS | Status: DC | PRN
  Administered 2017-12-26: 15 mg via INTRAVENOUS
  Administered 2017-12-26: 90 mg via INTRAVENOUS
  Administered 2017-12-26: 15 mg via INTRAVENOUS

## 2017-12-26 MED ORDER — LIDOCAINE HCL (PF) 2 % IJ SOLN
INTRAMUSCULAR | Status: AC
  Filled 2017-12-26: qty 10

## 2017-12-26 MED ORDER — PROPOFOL 500 MG/50ML IV EMUL
INTRAVENOUS | Status: DC | PRN
  Administered 2017-12-26: 140 ug/kg/min via INTRAVENOUS

## 2017-12-26 NOTE — Op Note (Addendum)
[facility] Gastroenterology Patient Name: Diana Wilkins Procedure Date: 12/26/2017 7:30 AM MRN: [id number] Account #: [id number] Date of Birth: [birth year] Admit Type: Outpatient Age: 62 Room: [facility] ENDO ROOM 3 Gender: Female Note Status: Finalized Procedure:            Colonoscopy Indications:          Screening for colorectal malignant neoplasm Providers:            Lollie Sails, MD Referring MD:         Juluis Rainier (Referring MD) Medicines:            Monitored Anesthesia Care Complications:        No immediate complications. Procedure:            Pre-Anesthesia Assessment:                       - ASA Grade Assessment: II - A patient with mild                        systemic disease.                       After obtaining informed consent, the colonoscope was                        passed under direct vision. Throughout the procedure,                        the patient's blood pressure, pulse, and oxygen                        saturations were monitored continuously. The                        Colonoscope was introduced through the anus and                        advanced to the the cecum, identified by appendiceal                        orifice and ileocecal valve. The colonoscopy was                        performed without difficulty. The patient tolerated the                        procedure well. The quality of the bowel preparation                        was fair. The patient tolerated the procedure well. Findings:      A few small-mouthed diverticula were found in the sigmoid colon.      Six sessile polyps were found in the recto-sigmoid colon. The polyps       were 1 to 2 mm in size. These polyps were removed with a cold biopsy       forceps. Resection and retrieval were complete.      Three sessile polyps were found in the rectum. The polyps were 3 to 7 mm       in size. These polyps were removed with a cold snare. Resection and   retrieval  were complete.      The retroflexed view of the distal rectum and anal verge was normal and       showed no anal or rectal abnormalities.      The digital rectal exam was normal. Impression:           - Preparation of the colon was fair.                       - Diverticulosis in the sigmoid colon.                       - Six 1 to 2 mm polyps at the recto-sigmoid colon,                        removed with a cold biopsy forceps. Resected and                        retrieved.                       - Three 3 to 7 mm polyps in the rectum, removed with a                        cold snare. Resected and retrieved. Recommendation:       - Discharge patient to home.                       - Await pathology results.                       - Telephone GI clinic for pathology results in 1 week. Procedure Code(s):    --- Professional ---                       [phone], Colonoscopy, flexible; with removal of tumor(s),                        polyp(s), or other lesion(s) by snare technique                       45380, 64, Colonoscopy, flexible; with biopsy, single                        or multiple Diagnosis Code(s):    --- Professional ---                       Z12.11, Encounter for screening for malignant neoplasm                        of colon                       D12.7, Benign neoplasm of rectosigmoid junction                       K62.1, Rectal polyp                       K57.30, Diverticulosis of large intestine without                        perforation or abscess  without bleeding CPT copyright 2017 American Medical Association. All rights reserved. The codes documented in this report are preliminary and upon coder review may  be revised to meet current compliance requirements. Lollie Sails, MD 12/26/2017 8:42:39 AM This report has been signed electronically. Number of Addenda: 0 Note Initiated On: 12/26/2017 7:30 AM Scope Withdrawal Time: 0 hours 14 minutes 59 seconds  Total  Procedure Duration: 0 hours 33 minutes 55 seconds       [facility]

## 2017-12-26 NOTE — Op Note (Signed)
[facility] Gastroenterology Patient Name: Diana Wilkins Procedure Date: 12/26/2017 7:31 AM MRN: [id number] Account #: [id number] Date of Birth: 10/18/1955 Admit Type: Outpatient Age: 62 Room: [facility] ENDO ROOM 3 Gender: Female Note Status: Finalized Procedure:            Upper GI endoscopy Indications:          Epigastric abdominal pain, Dyspepsia, Dysphagia Providers:            Lollie Sails, MD Referring MD:         Juluis Rainier (Referring MD) Medicines:            Monitored Anesthesia Care Complications:        No immediate complications. Procedure:            Pre-Anesthesia Assessment:                       - ASA Grade Assessment: II - A patient with mild                        systemic disease.                       After obtaining informed consent, the endoscope was                        passed under direct vision. Throughout the procedure,                        the patient's blood pressure, pulse, and oxygen                        saturations were monitored continuously. The Endoscope                        was introduced through the mouth, and advanced to the                        third part of duodenum. The upper GI endoscopy was                        accomplished without difficulty. The patient tolerated                        the procedure well. Findings:      LA Grade B (one or more mucosal breaks greater than 5 mm, not extending       between the tops of two mucosal folds) esophagitis with no bleeding was       found. Biopsies were taken with a cold forceps for histology.      A non-obstructing Schatzki ring was found at the gastroesophageal       junction. This was a transient in nature. A TTS dilator was passed       through the scope. Dilation with a 12-13.5-15 mm balloon dilator was       performed to the 15 mm size. It is noted the area in question would open       larger than the ballon at times-non-obstructing.      Diffuse and  patchy moderate inflammation characterized by adherent       blood, congestion (edema) and erythema was found in the gastric body  and       in the gastric antrum. Biopsies were taken with a cold forceps for       histology. Biopsies were taken with a cold forceps for Helicobacter       pylori testing.      The cardia and gastric fundus were normal on retroflexion.      There was a small lipoma, 6 to 10 mm in diameter, in the third portion       of the duodenum, positive pillow sign noted.      The exam of the duodenum was otherwise normal. Impression:           - LA Grade B erosive esophagitis. Biopsied.                       - Non-obstructing Schatzki ring. Dilated.                       - Bile gastritis. Biopsied.                       - Duodenal lipoma. Recommendation:       - Await pathology results.                       - Use Protonix (pantoprazole) 40 mg PO BID for 4 weeks.                       - Use Protonix (pantoprazole) 40 mg PO daily daily.                       - Use sucralfate tablets 1 gram PO QID daily. Procedure Code(s):    --- Professional ---                       [phone], Esophagogastroduodenoscopy, flexible, transoral;                        with transendoscopic balloon dilation of esophagus                        (less than 30 mm diameter)                       43239, Esophagogastroduodenoscopy, flexible, transoral;                        with biopsy, single or multiple Diagnosis Code(s):    --- Professional ---                       K20.8, Other esophagitis                       K22.2, Esophageal obstruction                       K29.60, Other gastritis without bleeding                       D17.5, Benign lipomatous neoplasm of intra-abdominal                        organs  R10.13, Epigastric pain                       R13.10, Dysphagia, unspecified CPT copyright 2017 American Medical Association. All rights reserved. The codes documented in  this report are preliminary and upon coder review may  be revised to meet current compliance requirements. Lollie Sails, MD 12/26/2017 8:02:54 AM This report has been signed electronically. Number of Addenda: 0 Note Initiated On: 12/26/2017 7:31 AM      [facility]

## 2017-12-26 NOTE — H&P (Signed)
Outpatient short stay form Pre-procedure 12/26/2017 7:04 AM Lollie Sails MD  Primary Physician:    Gaetano Net, NP  Reason for visit: EGD and colonoscopy  History of present illness: Patient is a 62 year old female presenting today as above.  Her last colonoscopy was in about 2008.  She also has complaint of upper abdominal pain dyspepsia and some dysphagia.  She had a barium swallow on 11/01/2017 showing a small reducible hiatal hernia with normal esophageal motility.  There is a moderate amount of spontaneous and induced reflux.  There is no evidence of stricture or esophagitis.  She did have a cholecystectomy a couple of years ago after a bout of gallstone pancreatitis.  She takes no aspirin or blood thinning agent.  Current Facility-Administered Medications:  .  0.9 %  sodium chloride infusion, , Intravenous, Continuous, Lollie Sails, MD .  0.9 %  sodium chloride infusion, , Intravenous, Continuous, Lollie Sails, MD  Medications Prior to Admission  Medication Sig Dispense Refill Last Dose  . Cholecalciferol (VITAMIN D-3) 1000 units CAPS Take 1 capsule by mouth daily.   Past Week at Unknown time  . docusate sodium (COLACE) 100 MG capsule Take 1 tablet once or twice daily as needed for constipation while taking narcotic pain medicine 30 capsule 0 Past Week at Unknown time  . ondansetron (ZOFRAN ODT) 4 MG disintegrating tablet Allow 1-2 tablets to dissolve in your mouth every 8 hours as needed for nausea/vomiting 30 tablet 0 Past Week at Unknown time  . oxybutynin (DITROPAN-XL) 5 MG 24 hr tablet Take 5 mg by mouth 2 (two) times daily.   Past Week at Unknown time  . pantoprazole (PROTONIX) 40 MG tablet Take 40 mg by mouth daily.   12/25/2017 at Unknown time  . omeprazole (PRILOSEC) 40 MG capsule Take 40 mg by mouth 2 (two) times daily.    Not Taking at Unknown time  . omeprazole (PRILOSEC) 40 MG capsule Take 1 capsule (40 mg total) by mouth daily. (Patient not taking: Reported  on 12/26/2017) 30 capsule 0 Not Taking at Unknown time     Allergies  Allergen Reactions  . Erythromycin Nausea Only  . Ciprofloxacin In D5w Rash     Past Medical History:  Diagnosis Date  . GERD (gastroesophageal reflux disease)   . Headache    H/O MIGRAINES  . Hyperlipidemia   . Hypertension   . Stress incontinence     Review of systems:      Physical Exam    Heart and lungs: Without rub or gallop, lungs are bilaterally clear.    HEENT: Normocephalic atraumatic eyes are anicteric    Other:    Pertinant exam for procedure: Soft nontender nondistended bowel sounds positive normoactive    Planned proceedures: EGD, colonoscopy and indicated procedures. I have discussed the risks benefits and complications of procedures to include not limited to bleeding, infection, perforation and the risk of sedation and the patient wishes to proceed.    Lollie Sails, MD Gastroenterology 12/26/2017  7:04 AM

## 2017-12-26 NOTE — Anesthesia Postprocedure Evaluation (Signed)
Anesthesia Post Note  Patient: CHARMAINE PLACIDO  Procedure(s) Performed: ESOPHAGOGASTRODUODENOSCOPY (EGD) WITH PROPOFOL (N/A ) COLONOSCOPY WITH PROPOFOL (N/A )  Patient location during evaluation: Endoscopy Anesthesia Type: General Level of consciousness: awake and alert Pain management: pain level controlled Vital Signs Assessment: post-procedure vital signs reviewed and stable Respiratory status: spontaneous breathing, nonlabored ventilation, respiratory function stable and patient connected to nasal cannula oxygen Cardiovascular status: blood pressure returned to baseline and stable Postop Assessment: no apparent nausea or vomiting Anesthetic complications: no     Last Vitals:  Vitals:   12/26/17 0914 12/26/17 0924  BP: 126/69 128/70  Pulse: 66 64  Resp: 14 (!) 22  Temp:    SpO2: 100% 100%    Last Pain:  Vitals:   12/26/17 0924  TempSrc:   PainSc: 2                  Martha Clan

## 2017-12-26 NOTE — Anesthesia Preprocedure Evaluation (Signed)
Anesthesia Evaluation  Patient identified by MRN, date of birth, ID band Patient awake    Reviewed: Allergy & Precautions, NPO status , Patient's Chart, lab work & pertinent test results, reviewed documented beta blocker date and time   History of Anesthesia Complications Negative for: history of anesthetic complications  Airway Mallampati: II  TM Distance: >3 FB     Dental  (+) Chipped, Upper Dentures, Partial Lower, Dental Advidsory Given   Pulmonary neg shortness of breath, neg COPD, neg recent URI, Current Smoker,           Cardiovascular Exercise Tolerance: Good hypertension, Pt. on medications (-) angina(-) CAD, (-) Past MI, (-) Cardiac Stents and (-) CABG (-) dysrhythmias (-) Valvular Problems/Murmurs     Neuro/Psych  Headaches, neg Seizures negative psych ROS   GI/Hepatic Neg liver ROS, GERD  Controlled,  Endo/Other  negative endocrine ROS  Renal/GU negative Renal ROS     Musculoskeletal   Abdominal   Peds  Hematology negative hematology ROS (+)   Anesthesia Other Findings Past Medical History: No date: GERD (gastroesophageal reflux disease) No date: Headache     Comment:  H/O MIGRAINES No date: Hyperlipidemia No date: Hypertension No date: Stress incontinence   Reproductive/Obstetrics                             Anesthesia Physical  Anesthesia Plan  ASA: II  Anesthesia Plan: General   Post-op Pain Management:    Induction: Intravenous  PONV Risk Score and Plan: 2 and Propofol infusion  Airway Management Planned: Nasal Cannula  Additional Equipment:   Intra-op Plan:   Post-operative Plan:   Informed Consent: I have reviewed the patients History and Physical, chart, labs and discussed the procedure including the risks, benefits and alternatives for the proposed anesthesia with the patient or authorized representative who has indicated his/her understanding and  acceptance.     Plan Discussed with: CRNA  Anesthesia Plan Comments:         Anesthesia Quick Evaluation

## 2017-12-26 NOTE — Anesthesia Post-op Follow-up Note (Signed)
Anesthesia QCDR form completed.        

## 2017-12-26 NOTE — Transfer of Care (Signed)
Immediate Anesthesia Transfer of Care Note  Patient: Diana Wilkins  Procedure(s) Performed: ESOPHAGOGASTRODUODENOSCOPY (EGD) WITH PROPOFOL (N/A ) COLONOSCOPY WITH PROPOFOL (N/A )  Patient Location: PACU and Endoscopy Unit  Anesthesia Type:General  Level of Consciousness: drowsy  Airway & Oxygen Therapy: Patient Spontanous Breathing and Patient connected to nasal cannula oxygen  Post-op Assessment: Report given to RN and Post -op Vital signs reviewed and stable  Post vital signs: Reviewed and stable  Last Vitals:  Vitals Value Taken Time  BP    Temp    Pulse    Resp    SpO2      Last Pain:  Vitals:   12/26/17 0655  TempSrc: Tympanic  PainSc: 5          Complications: No apparent anesthesia complications

## 2017-12-28 ENCOUNTER — Encounter: Payer: Self-pay | Admitting: Gastroenterology

## 2017-12-29 LAB — SURGICAL PATHOLOGY

## 2018-05-09 ENCOUNTER — Other Ambulatory Visit: Payer: Self-pay | Admitting: Nurse Practitioner

## 2018-05-09 DIAGNOSIS — Z1231 Encounter for screening mammogram for malignant neoplasm of breast: Secondary | ICD-10-CM

## 2018-08-10 ENCOUNTER — Ambulatory Visit
Admission: RE | Admit: 2018-08-10 | Discharge: 2018-08-10 | Disposition: A | Discharge status: Home / Self Care | Payer: BC Managed Care – PPO | Source: Ambulatory Visit | Attending: Nurse Practitioner | Admitting: Nurse Practitioner

## 2018-08-10 DIAGNOSIS — Z1231 Encounter for screening mammogram for malignant neoplasm of breast: Secondary | ICD-10-CM

## 2019-05-07 ENCOUNTER — Other Ambulatory Visit: Payer: Self-pay

## 2019-05-07 ENCOUNTER — Other Ambulatory Visit
Admission: RE | Admit: 2019-05-07 | Discharge: 2019-05-07 | Disposition: A | Discharge status: Home / Self Care | Payer: BC Managed Care – PPO | Source: Ambulatory Visit | Attending: Gastroenterology | Admitting: Gastroenterology

## 2019-05-07 DIAGNOSIS — Z20828 Contact with and (suspected) exposure to other viral communicable diseases: Secondary | ICD-10-CM | POA: Diagnosis present

## 2019-05-07 DIAGNOSIS — Z01812 Encounter for preprocedural laboratory examination: Secondary | ICD-10-CM | POA: Diagnosis not present

## 2019-05-07 LAB — SARS CORONAVIRUS 2 (TAT 6-24 HRS): SARS Coronavirus 2: NEGATIVE

## 2019-05-09 ENCOUNTER — Encounter: Payer: Self-pay | Admitting: *Deleted

## 2019-05-10 ENCOUNTER — Encounter: Payer: Self-pay | Admitting: *Deleted

## 2019-05-10 ENCOUNTER — Ambulatory Visit: Payer: BC Managed Care – PPO | Admitting: Anesthesiology

## 2019-05-10 ENCOUNTER — Other Ambulatory Visit: Payer: Self-pay

## 2019-05-10 ENCOUNTER — Encounter
Admission: RE | Disposition: A | Discharge status: Home / Self Care | Payer: Self-pay | Source: Ambulatory Visit | Attending: Gastroenterology

## 2019-05-10 ENCOUNTER — Ambulatory Visit
Admission: RE | Admit: 2019-05-10 | Discharge: 2019-05-10 | Disposition: A | Discharge status: Home / Self Care | Payer: BC Managed Care – PPO | Source: Ambulatory Visit | Attending: Gastroenterology | Admitting: Gastroenterology

## 2019-05-10 DIAGNOSIS — Z09 Encounter for follow-up examination after completed treatment for conditions other than malignant neoplasm: Secondary | ICD-10-CM | POA: Insufficient documentation

## 2019-05-10 DIAGNOSIS — K210 Gastro-esophageal reflux disease with esophagitis: Secondary | ICD-10-CM | POA: Insufficient documentation

## 2019-05-10 DIAGNOSIS — I10 Essential (primary) hypertension: Secondary | ICD-10-CM | POA: Diagnosis not present

## 2019-05-10 DIAGNOSIS — K2960 Other gastritis without bleeding: Secondary | ICD-10-CM | POA: Insufficient documentation

## 2019-05-10 DIAGNOSIS — Z79899 Other long term (current) drug therapy: Secondary | ICD-10-CM | POA: Diagnosis not present

## 2019-05-10 DIAGNOSIS — G43909 Migraine, unspecified, not intractable, without status migrainosus: Secondary | ICD-10-CM | POA: Insufficient documentation

## 2019-05-10 DIAGNOSIS — E785 Hyperlipidemia, unspecified: Secondary | ICD-10-CM | POA: Insufficient documentation

## 2019-05-10 DIAGNOSIS — K449 Diaphragmatic hernia without obstruction or gangrene: Secondary | ICD-10-CM | POA: Diagnosis not present

## 2019-05-10 DIAGNOSIS — K2270 Barrett's esophagus without dysplasia: Secondary | ICD-10-CM | POA: Insufficient documentation

## 2019-05-10 DIAGNOSIS — D175 Benign lipomatous neoplasm of intra-abdominal organs: Secondary | ICD-10-CM | POA: Diagnosis not present

## 2019-05-10 DIAGNOSIS — K319 Disease of stomach and duodenum, unspecified: Secondary | ICD-10-CM | POA: Insufficient documentation

## 2019-05-10 DIAGNOSIS — Z881 Allergy status to other antibiotic agents status: Secondary | ICD-10-CM | POA: Insufficient documentation

## 2019-05-10 HISTORY — PX: ESOPHAGOGASTRODUODENOSCOPY (EGD) WITH PROPOFOL: SHX5813

## 2019-05-10 SURGERY — ESOPHAGOGASTRODUODENOSCOPY (EGD) WITH PROPOFOL
Anesthesia: General

## 2019-05-10 MED ORDER — GLYCOPYRROLATE 0.2 MG/ML IJ SOLN
INTRAMUSCULAR | Status: DC | PRN
  Administered 2019-05-10: 0.2 mg via INTRAVENOUS

## 2019-05-10 MED ORDER — PROPOFOL 500 MG/50ML IV EMUL
INTRAVENOUS | Status: DC | PRN
  Administered 2019-05-10: 200 ug/kg/min via INTRAVENOUS

## 2019-05-10 MED ORDER — PROPOFOL 500 MG/50ML IV EMUL
INTRAVENOUS | Status: AC
  Filled 2019-05-10: qty 50

## 2019-05-10 MED ORDER — PROPOFOL 10 MG/ML IV BOLUS
INTRAVENOUS | Status: DC | PRN
  Administered 2019-05-10: 70 mg via INTRAVENOUS
  Administered 2019-05-10: 30 mg via INTRAVENOUS

## 2019-05-10 MED ORDER — SODIUM CHLORIDE 0.9 % IV SOLN
INTRAVENOUS | Status: DC
  Administered 2019-05-10: 12:00:00 via INTRAVENOUS

## 2019-05-10 MED ORDER — LIDOCAINE 2% (20 MG/ML) 5 ML SYRINGE
INTRAMUSCULAR | Status: DC | PRN
  Administered 2019-05-10: 50 mg via INTRAVENOUS

## 2019-05-10 MED ORDER — SODIUM CHLORIDE 0.9 % IV SOLN: INTRAVENOUS | Status: DC

## 2019-05-10 NOTE — Transfer of Care (Signed)
Immediate Anesthesia Transfer of Care Note  Patient: Diana Wilkins  Procedure(s) Performed: ESOPHAGOGASTRODUODENOSCOPY (EGD) WITH PROPOFOL (N/A )  Patient Location: Endoscopy Unit  Anesthesia Type:General  Level of Consciousness: sedated  Airway & Oxygen Therapy: Patient connected to nasal cannula oxygen  Post-op Assessment: Post -op Vital signs reviewed and stable  Post vital signs: stable  Last Vitals:  Vitals Value Taken Time  BP    Temp    Pulse    Resp    SpO2      Last Pain:  Vitals:   05/10/19 1127  TempSrc: Tympanic  PainSc: 0-No pain         Complications: No apparent anesthesia complications

## 2019-05-10 NOTE — Anesthesia Preprocedure Evaluation (Signed)
Anesthesia Evaluation  Patient identified by MRN, date of birth, ID band Patient awake    Reviewed: Allergy & Precautions, H&P , NPO status , Patient's Chart, lab work & pertinent test results, reviewed documented beta blocker date and time   Airway Mallampati: II   Neck ROM: full    Dental  (+) Poor Dentition   Pulmonary neg pulmonary ROS, Current Smoker,    Pulmonary exam normal        Cardiovascular Exercise Tolerance: Poor hypertension, On Medications negative cardio ROS Normal cardiovascular exam Rhythm:regular Rate:Normal     Neuro/Psych  Headaches, negative psych ROS   GI/Hepatic Neg liver ROS, GERD  Medicated,  Endo/Other  negative endocrine ROS  Renal/GU negative Renal ROS  negative genitourinary   Musculoskeletal   Abdominal   Peds  Hematology negative hematology ROS (+)   Anesthesia Other Findings Past Medical History: No date: GERD (gastroesophageal reflux disease) No date: Headache     Comment:  H/O MIGRAINES No date: Hyperlipidemia No date: Hypertension No date: Stress incontinence Past Surgical History: No date: BREAST EXCISIONAL BIOPSY; Bilateral     Comment:  neg 01/19/2016: CHOLECYSTECTOMY; N/A     Comment:  Procedure: LAPAROSCOPIC CHOLECYSTECTOMY WITH               INTRAOPERATIVE CHOLANGIOGRAM;  Surgeon: Clayburn Pert,               MD;  Location: [facility] ORS;  Service: General;  Laterality:               N/A; 09/07/2006: COLONOSCOPY 12/26/2017: COLONOSCOPY WITH PROPOFOL; N/A     Comment:  Procedure: COLONOSCOPY WITH PROPOFOL;  Surgeon:               Lollie Sails, MD;  Location: [facility] ENDOSCOPY;                Service: Endoscopy;  Laterality: N/A; 07/21/2014: ESOPHAGOGASTRODUODENOSCOPY 12/26/2017: ESOPHAGOGASTRODUODENOSCOPY (EGD) WITH PROPOFOL; N/A     Comment:  Procedure: ESOPHAGOGASTRODUODENOSCOPY (EGD) WITH               PROPOFOL;  Surgeon: Lollie Sails, MD;  Location:           [facility] ENDOSCOPY;  Service: Endoscopy;  Laterality: N/A; 1970: TONSILLECTOMY No date: TUBAL LIGATION BMI    Body Mass Index: 25.82 kg/m     Reproductive/Obstetrics negative OB ROS                             Anesthesia Physical Anesthesia Plan  ASA: III  Anesthesia Plan: General   Post-op Pain Management:    Induction:   PONV Risk Score and Plan:   Airway Management Planned:   Additional Equipment:   Intra-op Plan:   Post-operative Plan:   Informed Consent: I have reviewed the patients History and Physical, chart, labs and discussed the procedure including the risks, benefits and alternatives for the proposed anesthesia with the patient or authorized representative who has indicated his/her understanding and acceptance.     Dental Advisory Given  Plan Discussed with: CRNA  Anesthesia Plan Comments:         Anesthesia Quick Evaluation

## 2019-05-10 NOTE — Anesthesia Post-op Follow-up Note (Signed)
Anesthesia QCDR form completed.        

## 2019-05-10 NOTE — H&P (Addendum)
Outpatient short stay form Pre-procedure 05/10/2019 11:43 AM Lollie Sails MD  Primary Physician: Gaetano Net, NP  Reason for visit: EGD  History of present illness: Patient is a 63 year old female presenting today an EGD in regards her personal history of Barrett's esophagus.  This was initially diagnosed on EGD 12/26/2017.  At that time she also had a grade B esophagitis and a Schatzki ring that was dilated.  Currently she states she is doing well she occasionally gets some retrosternal discomfort has no difficulty swallowing heartburn.  Mild dyspepsia symptoms.  She is continuing on pantoprazole 40 mg daily.    Current Facility-Administered Medications:  .  0.9 %  sodium chloride infusion, , Intravenous, Continuous, Lollie Sails, MD .  0.9 %  sodium chloride infusion, , Intravenous, Continuous, Lollie Sails, MD, Last Rate: 20 mL/hr at 05/10/19 1138  Medications Prior to Admission  Medication Sig Dispense Refill Last Dose  . Cholecalciferol (VITAMIN D-3) 1000 units CAPS Take 1 capsule by mouth daily.   Past Week at Unknown time  . dicyclomine (BENTYL) 20 MG tablet Take 20 mg by mouth every 6 (six) hours.   05/09/2019 at 0800  . docusate sodium (COLACE) 100 MG capsule Take 1 tablet once or twice daily as needed for constipation while taking narcotic pain medicine 30 capsule 0 Past Week at Unknown time  . ondansetron (ZOFRAN ODT) 4 MG disintegrating tablet Allow 1-2 tablets to dissolve in your mouth every 8 hours as needed for nausea/vomiting 30 tablet 0 Past Week at Unknown time  . oxybutynin (DITROPAN-XL) 5 MG 24 hr tablet Take 5 mg by mouth 2 (two) times daily.   05/09/2019 at 0800  . pantoprazole (PROTONIX) 40 MG tablet Take 40 mg by mouth daily.   05/09/2019 at 0800  . omeprazole (PRILOSEC) 40 MG capsule Take 40 mg by mouth 2 (two) times daily.    Not Taking at Unknown time  . omeprazole (PRILOSEC) 40 MG capsule Take 1 capsule (40 mg total) by mouth daily. (Patient not  taking: Reported on 12/26/2017) 30 capsule 0 Not Taking at Unknown time     Allergies  Allergen Reactions  . Erythromycin Nausea Only  . Ciprofloxacin In D5w Rash     Past Medical History:  Diagnosis Date  . GERD (gastroesophageal reflux disease)   . Headache    H/O MIGRAINES  . Hyperlipidemia   . Hypertension   . Stress incontinence     Review of systems:      Physical Exam    Heart and lungs: Regular rate and rhythm without rub or gallop lungs are bilaterally clear    HEENT: Normocephalic atraumatic eyes are anicteric    Other:    Pertinant exam for procedure: Soft nontender nondistended bowel sounds positive normoactive    Planned proceedures: EGD and indicated procedures. I have discussed the risks benefits and complications of procedures to include not limited to bleeding, infection, perforation and the risk of sedation and the patient wishes to proceed.    Lollie Sails, MD Gastroenterology 05/10/2019  11:43 AM

## 2019-05-10 NOTE — Anesthesia Postprocedure Evaluation (Signed)
Anesthesia Post Note  Patient: Diana Wilkins  Procedure(s) Performed: ESOPHAGOGASTRODUODENOSCOPY (EGD) WITH PROPOFOL (N/A )  Patient location during evaluation: PACU Anesthesia Type: General Level of consciousness: awake and alert Pain management: pain level controlled Vital Signs Assessment: post-procedure vital signs reviewed and stable Respiratory status: spontaneous breathing, nonlabored ventilation and respiratory function stable Cardiovascular status: blood pressure returned to baseline and stable Postop Assessment: no apparent nausea or vomiting Anesthetic complications: no     Last Vitals:  Vitals:   05/10/19 1247 05/10/19 1257  BP: 129/68 135/71  Pulse: 88 84  Resp: (!) 21 20  Temp:    SpO2: 98% 99%    Last Pain:  Vitals:   05/10/19 1257  TempSrc:   PainSc: 0-No pain                 Durenda Hurt

## 2019-05-10 NOTE — Op Note (Addendum)
[facility] Patient Name: Diana Wilkins Procedure Date: 05/10/2019 12:01 PM MRN: [id number] Account #: [id number] Date of Birth: 08-02-1956 Admit Type: Outpatient Age: 63 Room: [facility] ENDO ROOM 3 Gender: Female Note Status: Finalized Procedure:            Upper GI endoscopy Indications:          Surveillance procedure, Dyspepsia, Follow-up of                        Barrett's esophagus Providers:            Lollie Sails, MD Medicines:            Monitored Anesthesia Care Complications:        No immediate complications. Procedure:            Pre-Anesthesia Assessment:                       - ASA Grade Assessment: III - A patient with severe                        systemic disease.                       After obtaining informed consent, the endoscope was                        passed under direct vision. Throughout the procedure,                        the patient's blood pressure, pulse, and oxygen                        saturations were monitored continuously. The Endoscope                        was introduced through the mouth, and advanced to the                        third part of duodenum. The upper GI endoscopy was                        accomplished without difficulty. The patient tolerated                        the procedure well. Findings:      The Z-line was variable.      There were esophageal mucosal changes suggestive of short-segment       Barrett's esophagus present in the lower third of the esophagus. The       maximum longitudinal extent of these mucosal changes was 1 cm in length.       Mucosa was biopsied with a cold forceps for histology. One specimen       bottle was sent to pathology.      several non-bleeding teleangiectasias in the mid esophagus.      Diffuse and patchy mild inflammation characterized by adherent blood,       congestion (edema) and erythema was found in the gastric body and in the       gastric  antrum. Biopsies were taken with a cold forceps for histology.       Biopsies were  taken with a cold forceps for Helicobacter pylori testing.      A small hiatal hernia was present.      There are 2 small lipoma in the third portion of the duodenum. Positive       pillow sign. Impression:           - Z-line variable.                       - Esophageal mucosal changes suggestive of                        short-segment Barrett's esophagus. Biopsied.                       - Bile erosive gastritis. Biopsied.                       - Small hiatal hernia.                       - Duodenal lipoma. Recommendation:       - Discharge patient to home.                       - Use Protonix (pantoprazole) 40 mg PO daily daily.                       - Use sucralfate tablets 1 gram PO QID for 1 month.                       - Use sucralfate tablets 1 gram PO BID for 2 months. Procedure Code(s):    --- Professional ---                       [phone], Esophagogastroduodenoscopy, flexible, transoral;                        with biopsy, single or multiple CPT copyright 2019 American Medical Association. All rights reserved. The codes documented in this report are preliminary and upon coder review may  be revised to meet current compliance requirements. Lollie Sails, MD 05/10/2019 12:38:04 PM This report has been signed electronically. Number of Addenda: 0 Note Initiated On: 05/10/2019 12:01 PM      [facility]

## 2019-05-10 NOTE — OR Nursing (Signed)
Lab into draw blood, pt ready for dc.

## 2019-05-11 LAB — ANTI-SCLERODERMA ANTIBODY: Scleroderma (Scl-70) (ENA) Antibody, IgG: <0.2 AI (ref 0.0–0.9)

## 2019-05-13 LAB — SURGICAL PATHOLOGY

## 2019-05-22 ENCOUNTER — Other Ambulatory Visit: Payer: Self-pay | Admitting: Nurse Practitioner

## 2019-05-22 DIAGNOSIS — Z1231 Encounter for screening mammogram for malignant neoplasm of breast: Secondary | ICD-10-CM

## 2019-08-12 ENCOUNTER — Other Ambulatory Visit: Payer: Self-pay

## 2019-08-12 ENCOUNTER — Ambulatory Visit
Admission: RE | Admit: 2019-08-12 | Discharge: 2019-08-12 | Disposition: A | Discharge status: Home / Self Care | Payer: BC Managed Care – PPO | Source: Ambulatory Visit | Attending: Nurse Practitioner | Admitting: Nurse Practitioner

## 2019-08-12 DIAGNOSIS — Z1231 Encounter for screening mammogram for malignant neoplasm of breast: Secondary | ICD-10-CM

## 2020-06-25 ENCOUNTER — Other Ambulatory Visit: Payer: Self-pay | Admitting: Nurse Practitioner

## 2020-06-25 DIAGNOSIS — Z1231 Encounter for screening mammogram for malignant neoplasm of breast: Secondary | ICD-10-CM

## 2020-07-09 ENCOUNTER — Other Ambulatory Visit: Payer: Self-pay | Admitting: Nurse Practitioner

## 2020-07-09 DIAGNOSIS — Z13820 Encounter for screening for osteoporosis: Secondary | ICD-10-CM

## 2020-08-19 ENCOUNTER — Ambulatory Visit
Admission: RE | Admit: 2020-08-19 | Discharge: 2020-08-19 | Disposition: A | Discharge status: Home / Self Care | Payer: Medicare HMO | Source: Ambulatory Visit | Attending: Nurse Practitioner | Admitting: Nurse Practitioner

## 2020-08-19 ENCOUNTER — Encounter (INDEPENDENT_AMBULATORY_CARE_PROVIDER_SITE_OTHER): Payer: Self-pay

## 2020-08-19 ENCOUNTER — Other Ambulatory Visit: Payer: Self-pay

## 2020-08-19 DIAGNOSIS — M810 Age-related osteoporosis without current pathological fracture: Secondary | ICD-10-CM | POA: Insufficient documentation

## 2020-08-19 DIAGNOSIS — Z1231 Encounter for screening mammogram for malignant neoplasm of breast: Secondary | ICD-10-CM

## 2020-08-19 DIAGNOSIS — Z780 Asymptomatic menopausal state: Secondary | ICD-10-CM | POA: Diagnosis not present

## 2020-08-19 DIAGNOSIS — Z13820 Encounter for screening for osteoporosis: Secondary | ICD-10-CM

## 2021-07-13 ENCOUNTER — Other Ambulatory Visit: Payer: Self-pay | Admitting: Nurse Practitioner

## 2021-07-13 DIAGNOSIS — Z1231 Encounter for screening mammogram for malignant neoplasm of breast: Secondary | ICD-10-CM

## 2021-08-25 ENCOUNTER — Other Ambulatory Visit: Payer: Self-pay

## 2021-08-25 ENCOUNTER — Ambulatory Visit
Admission: RE | Admit: 2021-08-25 | Discharge: 2021-08-25 | Disposition: A | Discharge status: Home / Self Care | Payer: Medicare HMO | Source: Ambulatory Visit | Attending: Nurse Practitioner | Admitting: Nurse Practitioner

## 2021-08-25 DIAGNOSIS — Z1231 Encounter for screening mammogram for malignant neoplasm of breast: Secondary | ICD-10-CM | POA: Insufficient documentation

## 2021-09-16 ENCOUNTER — Other Ambulatory Visit: Payer: Self-pay

## 2021-09-16 DIAGNOSIS — Z87891 Personal history of nicotine dependence: Secondary | ICD-10-CM

## 2021-09-16 DIAGNOSIS — F17210 Nicotine dependence, cigarettes, uncomplicated: Secondary | ICD-10-CM

## 2021-10-05 ENCOUNTER — Other Ambulatory Visit: Payer: Self-pay

## 2021-10-05 ENCOUNTER — Encounter: Payer: Self-pay | Admitting: Acute Care

## 2021-10-05 ENCOUNTER — Ambulatory Visit (INDEPENDENT_AMBULATORY_CARE_PROVIDER_SITE_OTHER): Payer: Medicare HMO | Admitting: Acute Care

## 2021-10-05 ENCOUNTER — Ambulatory Visit
Admission: RE | Admit: 2021-10-05 | Discharge: 2021-10-05 | Disposition: A | Discharge status: Home / Self Care | Payer: Medicare HMO | Source: Ambulatory Visit | Attending: Acute Care | Admitting: Acute Care

## 2021-10-05 DIAGNOSIS — F17210 Nicotine dependence, cigarettes, uncomplicated: Secondary | ICD-10-CM | POA: Diagnosis not present

## 2021-10-05 DIAGNOSIS — Z87891 Personal history of nicotine dependence: Secondary | ICD-10-CM | POA: Diagnosis not present

## 2021-10-05 NOTE — Patient Instructions (Signed)
Thank you for participating in the [facility] Screening Program. °It was our pleasure to meet you today. °We will call you with the results of your scan within the next few days. °Your scan will be assigned a Lung RADS category score by the physicians reading the scans.  °This Lung RADS score determines follow up scanning.  °See below for description of categories, and follow up screening recommendations. °We will be in touch to schedule your follow up screening annually or based on recommendations of our providers. °We will fax a copy of your scan results to your Primary Care Physician, or the physician who referred you to the program, to ensure they have the results. °Please call the office if you have any questions or concerns regarding your scanning experience or results.  °Our office number is [phone]. °Please speak with Denise Phelps, RN. She is our Lung Cancer Screening RN. °If she is unavailable when you call, please have the office staff send her a message. She will return your call at her earliest convenience. °Remember, if your scan is normal, we will scan you annually as long as you continue to meet the criteria for the program. (Age 55-77, Current smoker or smoker who has quit within the last 15 years). °If you are a smoker, remember, quitting is the single most powerful action that you can take to decrease your risk of lung cancer and other pulmonary, breathing related problems. °We know quitting is hard, and we are here to help.  °Please let us know if there is anything we can do to help you meet your goal of quitting. °If you are a former smoker, congratulations. We are proud of you! Remain smoke free! °Remember you can refer friends or family members through the number above.  °We will screen them to make sure they meet criteria for the program. °Thank you for helping us take better care of you by participating in Lung Screening. ° °You can receive free nicotine replacement therapy  ( patches, gum or mints) by calling 1-800-QUIT NOW. Please call so we can get you on the path to becoming  a non-smoker. I know it is hard, but you can do this! ° °Lung RADS Categories: ° °Lung RADS 1: no nodules or definitely non-concerning nodules.  °Recommendation is for a repeat annual scan in 12 months. ° °Lung RADS 2:  nodules that are non-concerning in appearance and behavior with a very low likelihood of becoming an active cancer. °Recommendation is for a repeat annual scan in 12 months. ° °Lung RADS 3: nodules that are probably non-concerning , includes nodules with a low likelihood of becoming an active cancer.  Recommendation is for a 6-month repeat screening scan. Often noted after an upper respiratory illness. We will be in touch to make sure you have no questions, and to schedule your 6-month scan. ° °Lung RADS 4 A: nodules with concerning findings, recommendation is most often for a follow up scan in 3 months or additional testing based on our provider's assessment of the scan. We will be in touch to make sure you have no questions and to schedule the recommended 3 month follow up scan. ° °Lung RADS 4 B:  indicates findings that are concerning. We will be in touch with you to schedule additional diagnostic testing based on our provider's  assessment of the scan. ° °Hypnosis for smoking cessation  °[employer] °[phone] ° °Acupuncture for smoking cessation  °[facility] °[phone]  °

## 2021-10-05 NOTE — Progress Notes (Signed)
Virtual Visit via Telephone Note  I connected with Diana Wilkins on 10/05/21 at  9:00 AM EST by telephone and verified that I am speaking with the correct person using two identifiers.  Location: Patient: At home Provider: [address], [place], [place], [address]    I discussed the limitations, risks, security and privacy concerns of performing an evaluation and management service by telephone and the availability of in person appointments. I also discussed with the patient that there may be a patient responsible charge related to this service. The patient expressed understanding and agreed to proceed.    Shared Decision Making Visit Lung Cancer Screening Program [phone])   Eligibility: Age 66 y.o. Pack Years Smoking History Calculation 21.5 pack year smoking history (# packs/per year x # years smoked) Recent History of coughing up blood  no Unexplained weight loss? no ( >Than 15 pounds within the last 6 months ) Prior History Lung / other cancer no (Diagnosis within the last 5 years already requiring surveillance chest CT Scans). Smoking Status Current Smoker Former Smokers: Years since quit: NA  Quit Date: NA  Visit Components: Discussion included one or more decision making aids. yes Discussion included risk/benefits of screening. yes Discussion included potential follow up diagnostic testing for abnormal scans. yes Discussion included meaning and risk of over diagnosis. yes Discussion included meaning and risk of False Positives. yes Discussion included meaning of total radiation exposure. yes  Counseling Included: Importance of adherence to annual lung cancer LDCT screening. yes Impact of comorbidities on ability to participate in the program. yes Ability and willingness to under diagnostic treatment. yes  Smoking Cessation Counseling: Current Smokers:  Discussed importance of smoking cessation. yes Information about tobacco cessation classes and  interventions provided to patient. yes Patient provided with "ticket" for LDCT Scan. yes Symptomatic Patient. no  Counseling NA Diagnosis Code: Tobacco Use Z72.0 Asymptomatic Patient yes  Counseling (Intermediate counseling: > three minutes counseling) [id number] Former Smokers:  Discussed the importance of maintaining cigarette abstinence. yes Diagnosis Code: Personal History of Nicotine Dependence. U23.536 Information about tobacco cessation classes and interventions provided to patient. Yes Patient provided with "ticket" for LDCT Scan. yes Written Order for Lung Cancer Screening with LDCT placed in Epic. Yes (CT Chest Lung Cancer Screening Low Dose W/O CM) [id number] Z12.2-Screening of respiratory organs Z87.891-Personal history of nicotine dependence  I have spent 25 minutes of face to face/ virtual visit   time with  Diana Wilkins discussing the risks and benefits of lung cancer screening. We viewed / discussed a power point together that explained in detail the above noted topics. We paused at intervals to allow for questions to be asked and answered to ensure understanding.We discussed that the single most powerful action that she can take to decrease her risk of developing lung cancer is to quit smoking. We discussed whether or not she is ready to commit to setting a quit date. We discussed options for tools to aid in quitting smoking including nicotine replacement therapy, non-nicotine medications, support groups, Quit Smart classes, and behavior modification. We discussed that often times setting smaller, more achievable goals, such as eliminating 1 cigarette a day for a week and then 2 cigarettes a day for a week can be helpful in slowly decreasing the number of cigarettes smoked. This allows for a sense of accomplishment as well as providing a clinical benefit. I provided  her  with smoking cessation  information  with contact information for community resources, classes, free nicotine  replacement  therapy, and access to mobile apps, text messaging, and on-line smoking cessation help. I have also provided  her  the office contact information in the event she needs to contact me, or the screening staff. We discussed the time and location of the scan, and that either Doroteo Glassman RN, Joella Prince, RN  or I will call / send a letter with the results within 24-72 hours of receiving them. The patient verbalized understanding of all of  the above and had no further questions upon leaving the office. They have my contact information in the event they have any further questions.  I spent 3 minutes counseling on smoking cessation and the health risks of continued tobacco abuse.  I explained to the patient that there has been a high incidence of coronary artery disease noted on these exams. I explained that this is a non-gated exam therefore degree or severity cannot be determined. This patient is on statin therapy. I have asked the patient to follow-up with their PCP regarding any incidental finding of coronary artery disease and management with diet or medication as their PCP  feels is clinically indicated. The patient verbalized understanding of the above and had no further questions upon completion of the visit.      Magdalen Spatz, NP 10/05/2021

## 2021-10-07 ENCOUNTER — Other Ambulatory Visit: Payer: Self-pay | Admitting: Acute Care

## 2021-10-07 DIAGNOSIS — Z87891 Personal history of nicotine dependence: Secondary | ICD-10-CM

## 2021-10-07 DIAGNOSIS — F17210 Nicotine dependence, cigarettes, uncomplicated: Secondary | ICD-10-CM

## 2022-01-21 ENCOUNTER — Other Ambulatory Visit: Payer: Self-pay

## 2022-01-21 ENCOUNTER — Emergency Department
Admission: EM | Admit: 2022-01-21 | Discharge: 2022-01-22 | Disposition: A | Discharge status: Home / Self Care | Payer: Medicare HMO | Attending: Emergency Medicine | Admitting: Emergency Medicine

## 2022-01-21 ENCOUNTER — Encounter: Payer: Self-pay | Admitting: Emergency Medicine

## 2022-01-21 DIAGNOSIS — R1012 Left upper quadrant pain: Secondary | ICD-10-CM | POA: Insufficient documentation

## 2022-01-21 DIAGNOSIS — R112 Nausea with vomiting, unspecified: Secondary | ICD-10-CM

## 2022-01-21 LAB — CBC
HCT: 45.2 % (ref 36.0–46.0)
Hemoglobin: 14.8 g/dL (ref 12.0–15.0)
MCH: 30.3 pg (ref 26.0–34.0)
MCHC: 32.7 g/dL (ref 30.0–36.0)
MCV: 92.6 fL (ref 80.0–100.0)
Platelets: 424 10*3/uL — ABNORMAL HIGH (ref 150–400)
RBC: 4.88 MIL/uL (ref 3.87–5.11)
RDW: 13.2 % (ref 11.5–15.5)
WBC: 23.4 10*3/uL — ABNORMAL HIGH (ref 4.0–10.5)
nRBC: 0 % (ref 0.0–0.2)

## 2022-01-21 LAB — COMPREHENSIVE METABOLIC PANEL
ALT: 13 U/L (ref 0–44)
AST: 19 U/L (ref 15–41)
Albumin: 4.8 g/dL (ref 3.5–5.0)
Alkaline Phosphatase: 77 U/L (ref 38–126)
Anion gap: 11 (ref 5–15)
BUN: 12 mg/dL (ref 8–23)
CO2: 25 mmol/L (ref 22–32)
Calcium: 9.9 mg/dL (ref 8.9–10.3)
Chloride: 104 mmol/L (ref 98–111)
Creatinine, Ser: 0.75 mg/dL (ref 0.44–1.00)
GFR, Estimated: >60 mL/min (ref >60)
Glucose, Bld: 142 mg/dL — ABNORMAL HIGH (ref 70–99)
Potassium: 4.2 mmol/L (ref 3.5–5.1)
Sodium: 140 mmol/L (ref 135–145)
Total Bilirubin: 0.6 mg/dL (ref 0.3–1.2)
Total Protein: 8.1 g/dL (ref 6.5–8.1)

## 2022-01-21 LAB — LIPASE, BLOOD: Lipase: 31 U/L (ref 11–51)

## 2022-01-21 LAB — TROPONIN I (HIGH SENSITIVITY): Troponin I (High Sensitivity): 5 ng/L (ref <18)

## 2022-01-21 MED ORDER — ONDANSETRON 4 MG PO TBDP: 4.0000 mg | ORAL_TABLET | Freq: Three times a day (TID) | ORAL | 0 refills | Status: DC | PRN

## 2022-01-21 MED ORDER — ONDANSETRON 4 MG PO TBDP
4.0000 mg | ORAL_TABLET | Freq: Once | ORAL | Status: AC | PRN
  Administered 2022-01-21: 4 mg via ORAL
  Filled 2022-01-21: qty 1

## 2022-01-21 NOTE — ED Triage Notes (Signed)
Pt presents to ER with LUQ pain, nausea and vomiting since 4:30 pm this afternoon. Pt talks in complete sentences no distress noted. Pt reports history of GERD .

## 2022-01-21 NOTE — ED Provider Notes (Signed)
[facility] Provider Note    Event Date/Time   First MD Initiated Contact with Patient 01/21/22 2308     (approximate)   History   Abdominal Pain   HPI  Diana Wilkins is a 66 y.o. female who presents to the ED for evaluation of Abdominal Pain   I reviewed PCP visit from November.  History of GERD, HLD.  History of cholecystectomy and tubal ligation. I review EGD from 04/2019 Barrett's esophagus, erosive gastritis and small hiatal hernia.  Patient presents to the ED for evaluation of an episode of LUQ abdominal discomfort and recurrent emesis that is since resolved.  She reports primarily having nausea and recurrent emesis, subsequently developing some mild LUQ abdominal burning pain that she attributes to her known GERD.  She reports she got some Zofran in triage and feels fine now and has no complaints.  She is requesting discharge during my initial evaluation.   Physical Exam   Triage Vital Signs: ED Triage Vitals  Enc Vitals Group     BP 01/21/22 2217 131/68     Pulse Rate 01/21/22 2217 92     Resp 01/21/22 2217 16     Temp 01/21/22 2217 98.8 F (37.1 C)     Temp Source 01/21/22 2217 Oral     SpO2 01/21/22 2217 96 %     Weight 01/21/22 2109 150 lb (68 kg)     Height 01/21/22 2109 '5\' 7"'$  (1.702 m)     Head Circumference --      Peak Flow --      Pain Score 01/21/22 2109 6     Pain Loc --      Pain Edu? --      Excl. in [place]? --     Most recent vital signs: Vitals:   01/22/22 0000 01/22/22 0001  BP: 134/65   Pulse: 75 75  Resp: 20   Temp: 98.6 F (37 C)   SpO2: 100% 100%    General: Awake, no distress.  CV:  Good peripheral perfusion. RRR Resp:  Normal effort. CTAB Abd:  No distention.  Soft and benign throughout MSK:  No deformity noted.  Neuro:  No focal deficits appreciated. CTAB Other:     ED Results / Procedures / Treatments   Labs (all labs ordered are listed, but only abnormal results are displayed) Labs Reviewed   COMPREHENSIVE METABOLIC PANEL - Abnormal; Notable for the following components:      Result Value   Glucose, Bld 142 (*)    All other components within normal limits  CBC - Abnormal; Notable for the following components:   WBC 23.4 (*)    Platelets 424 (*)    All other components within normal limits  LIPASE, BLOOD  TROPONIN I (HIGH SENSITIVITY)    EKG Sinus rhythm with a rate of 75 bpm.  Normal axis and intervals.  Marginal J-point depression to V6.  No clear evidence of acute ischemia.  No recent comparison.  RADIOLOGY   Official radiology report(s): No results found.  PROCEDURES and INTERVENTIONS:  .1-3 Lead EKG Interpretation Performed by: Vladimir Crofts, MD Authorized by: Vladimir Crofts, MD     Interpretation: normal     ECG rate:  74   ECG rate assessment: normal     Rhythm: sinus rhythm     Ectopy: none     Conduction: normal    Medications  ondansetron (ZOFRAN-ODT) disintegrating tablet 4 mg (4 mg Oral Given 01/21/22 2116)  IMPRESSION / MDM / ASSESSMENT AND PLAN / ED COURSE  I reviewed the triage vital signs and the nursing notes.  Differential diagnosis includes, but is not limited to, GERD, gastritis, ACS, SBO, diverticulitis  {Patient presents with symptoms of an acute illness or injury that is potentially life-threatening.  66 year old female with history of GERD presents to the ED after multiple episodes of emesis at home, since resolved and ultimately suitable for outpatient management.  She looks systemically well and has a reassuring examination without abdominal pain or tenderness.  No signs of dehydration, distress or any acute pathology on my exam.  Her blood work demonstrates a pretty significant leukocytosis, but is otherwise reassuring.  Negative troponin, lipase and normal metabolic panel.  EKG is nonischemic. We discussed leukocytosis and my recommendation for abdominal imaging, but she declines indicating that she feels fine and just wants to  get out of here.  As below, she acknowledges the risks of this and we discussed close return precautions.  Clinical Course as of 01/22/22 9798  Fri Jan 21, 2022  2356 Long discussion regarding shared decision making and plan of care.  We discussed leukocytosis being nonspecific.  She reports that she really feels fine now and has no complaints and is requesting discharge.  We discussed the possibility of undiagnosed pathology that could be life-threatening, she acknowledges this and still prefers to go home.  We discussed that he is getting an EKG and p.o. challenge prior to discharge and she is agreeable. [DS]    Clinical Course User Index [DS] Vladimir Crofts, MD     FINAL CLINICAL IMPRESSION(S) / ED DIAGNOSES   Final diagnoses:  Nausea and vomiting, unspecified vomiting type     Rx / DC Orders   ED Discharge Orders          Ordered    ondansetron (ZOFRAN-ODT) 4 MG disintegrating tablet  Every 8 hours PRN        01/21/22 2359             Note:  This document was prepared using Dragon voice recognition software and may include unintentional dictation errors.   Vladimir Crofts, MD 01/22/22 [phone]

## 2022-01-22 NOTE — Discharge Instructions (Signed)
I sent a prescription for the nausea medicine (Zofran/ondansetron) that we gave you here to the pharmacy for you.  If you develop any further worsening symptoms despite this

## 2022-01-22 NOTE — ED Notes (Signed)
Patient provided with ginger ale.  Able to tolerate PO intake.  Has mild nausea

## 2022-02-11 IMAGING — CT CT CHEST LUNG CANCER SCREENING LOW DOSE W/O CM
1 series · 10 of 10 positions shown, 13 images · non-contrast
Comparison: None.

CLINICAL DATA: Lung cancer screening. 21.5 pack-year history.
Current asymptomatic smoker.



[ct lung segmentation data · axial · 0.64mm/px · z∈[-695,-695]mm · 10 of 279 frames shown]
[frame 1/279  mediastinal]
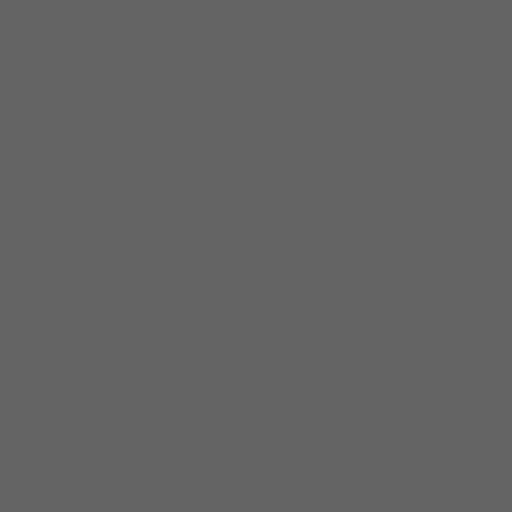
[frame 1/279  lung]
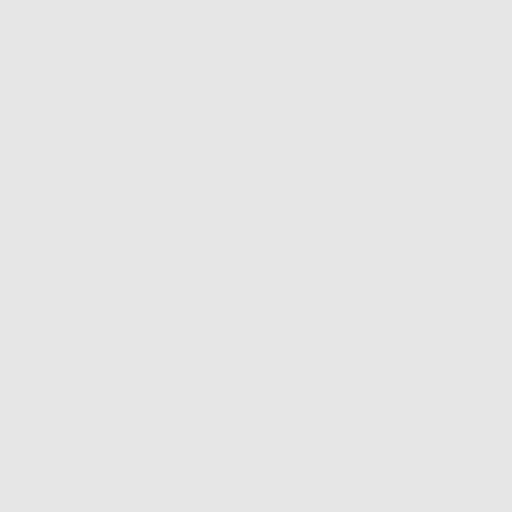
[frame 31/279  lung]
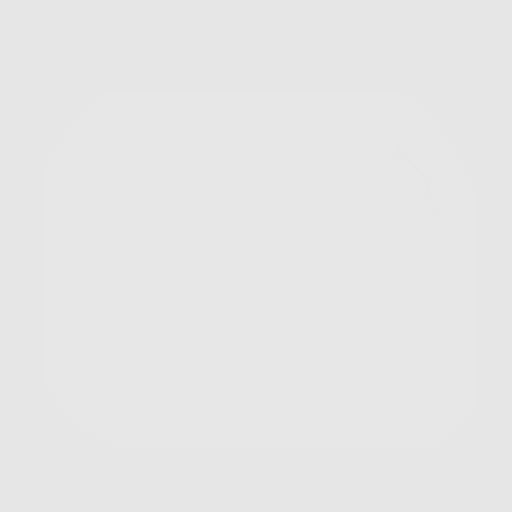
[frame 62/279  lung]
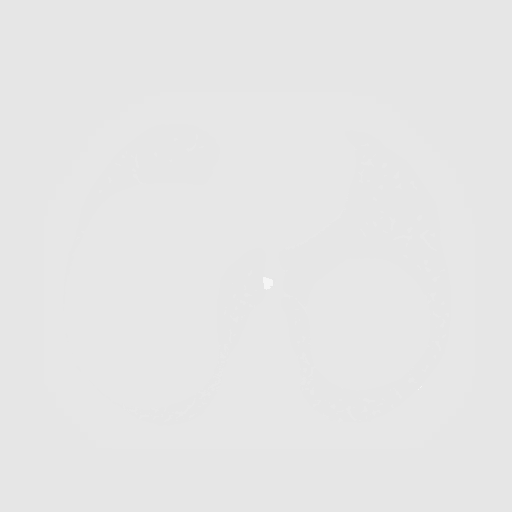
[frame 93/279  lung]
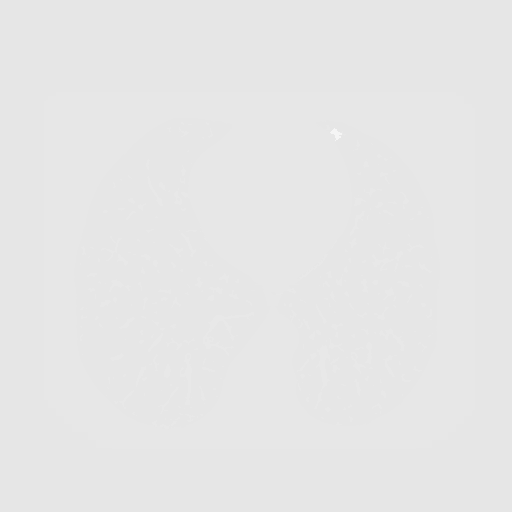
[frame 124/279  mediastinal]
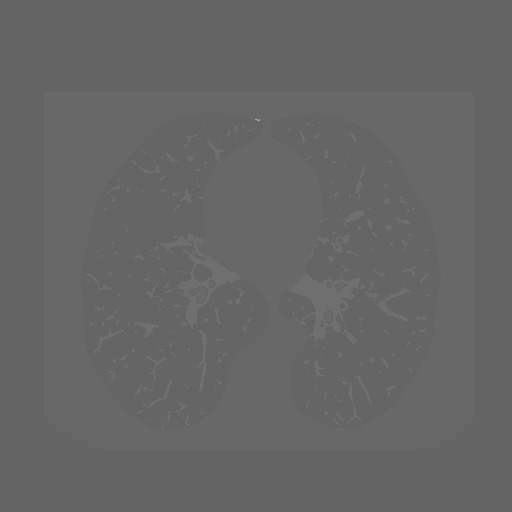
[frame 124/279  lung]
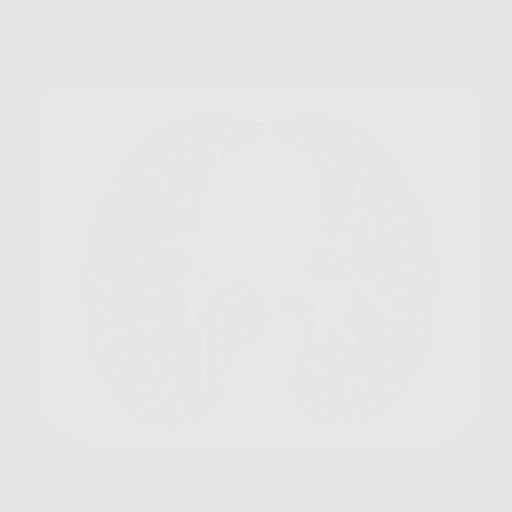
[frame 155/279  lung]
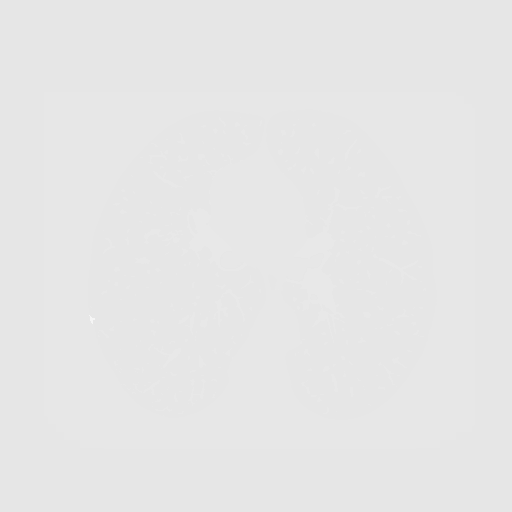
[frame 186/279  lung]
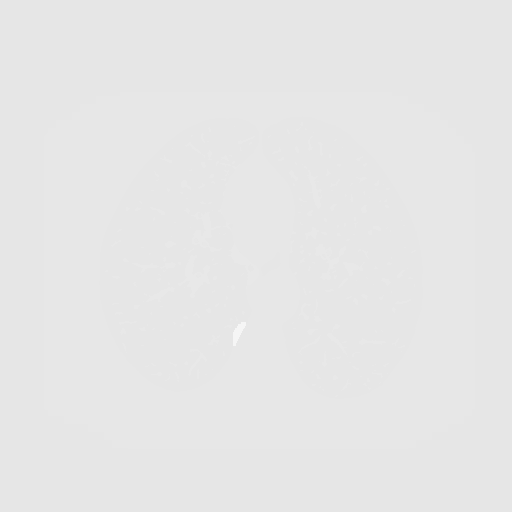
[frame 217/279  lung]
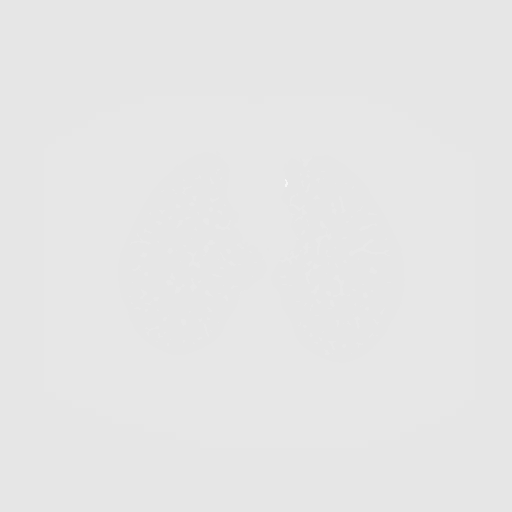
[frame 248/279  mediastinal]
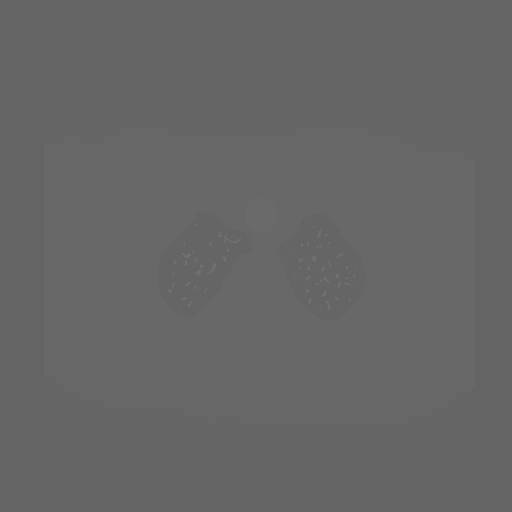
[frame 248/279  lung]
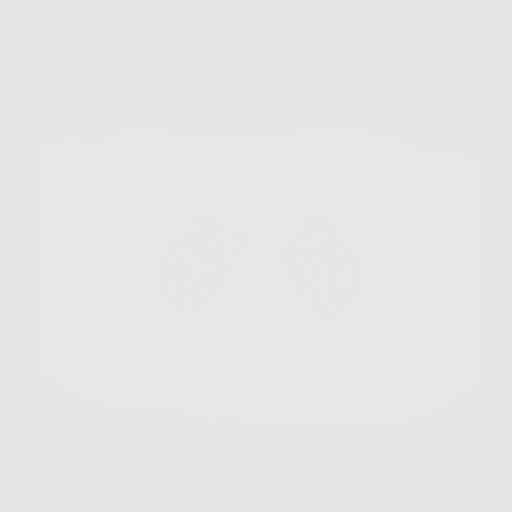
[frame 279/279  lung]
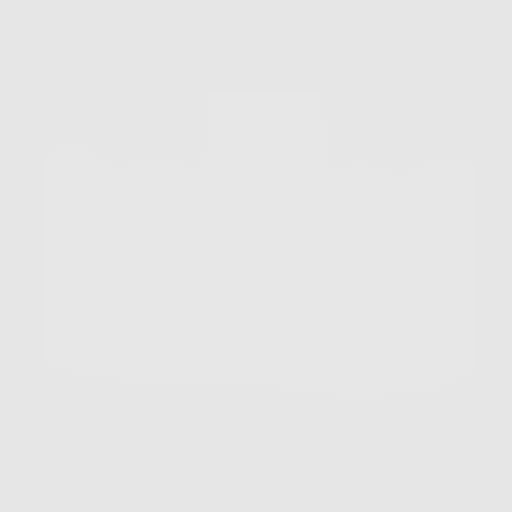

[10 of 10 positions shown; findings below may reference images not displayed]

FINDINGS: Cardiovascular: Normal heart size. Aortic atherosclerosis. Coronary
artery atherosclerotic calcifications.

Mediastinum/Nodes: No enlarged mediastinal, hilar, or axillary lymph
nodes. Thyroid gland, trachea, and esophagus demonstrate no
significant findings.

Lungs/Pleura: No pleural effusion, airspace consolidation, or
pneumothorax. Paraseptal and centrilobular emphysema. Single small
pulmonary nodule within the right lower lobe has a mean derived
diameter of 4.3 mm. No suspicious lung nodules.

Upper Abdomen: No acute abnormality.

Musculoskeletal: No chest wall mass or suspicious bone lesions
identified. Degenerative disc disease noted within the thoracic
spine. No acute or suspicious osseous findings.
IMPRESSION: 1. Lung-RADS 2, benign appearance or behavior. Continue annual
screening with low-dose chest CT without contrast in 12 months.
2. Coronary artery calcifications.
3. Aortic Atherosclerosis (KP8ZT-WGA.A) and Emphysema (KP8ZT-E41.3).

## 2022-07-14 ENCOUNTER — Other Ambulatory Visit: Payer: Self-pay | Admitting: Nurse Practitioner

## 2022-07-14 DIAGNOSIS — Z1231 Encounter for screening mammogram for malignant neoplasm of breast: Secondary | ICD-10-CM

## 2022-07-19 ENCOUNTER — Ambulatory Visit
Admission: RE | Admit: 2022-07-19 | Discharge: 2022-07-19 | Disposition: A | Discharge status: Home / Self Care | Payer: Medicare HMO | Source: Ambulatory Visit | Attending: Gastroenterology | Admitting: Gastroenterology

## 2022-07-19 ENCOUNTER — Encounter
Admission: RE | Disposition: A | Discharge status: Home / Self Care | Payer: Self-pay | Source: Ambulatory Visit | Attending: Gastroenterology

## 2022-07-19 ENCOUNTER — Ambulatory Visit: Payer: Medicare HMO | Admitting: Certified Registered"

## 2022-07-19 ENCOUNTER — Encounter: Payer: Self-pay | Admitting: *Deleted

## 2022-07-19 DIAGNOSIS — K31A19 Gastric intestinal metaplasia without dysplasia, unspecified site: Secondary | ICD-10-CM | POA: Insufficient documentation

## 2022-07-19 DIAGNOSIS — R1013 Epigastric pain: Secondary | ICD-10-CM | POA: Diagnosis not present

## 2022-07-19 DIAGNOSIS — D175 Benign lipomatous neoplasm of intra-abdominal organs: Secondary | ICD-10-CM | POA: Diagnosis not present

## 2022-07-19 DIAGNOSIS — I10 Essential (primary) hypertension: Secondary | ICD-10-CM | POA: Diagnosis not present

## 2022-07-19 DIAGNOSIS — Z538 Procedure and treatment not carried out for other reasons: Secondary | ICD-10-CM | POA: Diagnosis not present

## 2022-07-19 DIAGNOSIS — Z87891 Personal history of nicotine dependence: Secondary | ICD-10-CM | POA: Diagnosis not present

## 2022-07-19 DIAGNOSIS — K219 Gastro-esophageal reflux disease without esophagitis: Secondary | ICD-10-CM | POA: Diagnosis not present

## 2022-07-19 DIAGNOSIS — R1084 Generalized abdominal pain: Secondary | ICD-10-CM | POA: Insufficient documentation

## 2022-07-19 DIAGNOSIS — K2950 Unspecified chronic gastritis without bleeding: Secondary | ICD-10-CM | POA: Diagnosis not present

## 2022-07-19 HISTORY — PX: COLONOSCOPY WITH PROPOFOL: SHX5780

## 2022-07-19 HISTORY — PX: ESOPHAGOGASTRODUODENOSCOPY (EGD) WITH PROPOFOL: SHX5813

## 2022-07-19 SURGERY — ESOPHAGOGASTRODUODENOSCOPY (EGD) WITH PROPOFOL
Anesthesia: General

## 2022-07-19 MED ORDER — LIDOCAINE HCL (PF) 2 % IJ SOLN
INTRAMUSCULAR | Status: AC
  Filled 2022-07-19: qty 5

## 2022-07-19 MED ORDER — LIDOCAINE HCL (CARDIAC) PF 100 MG/5ML IV SOSY
PREFILLED_SYRINGE | INTRAVENOUS | Status: DC | PRN
  Administered 2022-07-19: 100 mg via INTRAVENOUS

## 2022-07-19 MED ORDER — SODIUM CHLORIDE 0.9 % IV SOLN: INTRAVENOUS | Status: DC

## 2022-07-19 MED ORDER — PROPOFOL 1000 MG/100ML IV EMUL
INTRAVENOUS | Status: AC
  Filled 2022-07-19: qty 100

## 2022-07-19 MED ORDER — PROPOFOL 10 MG/ML IV BOLUS
INTRAVENOUS | Status: DC | PRN
  Administered 2022-07-19: 100 mg via INTRAVENOUS
  Administered 2022-07-19: 150 ug/kg/min via INTRAVENOUS

## 2022-07-19 NOTE — Interval H&P Note (Signed)
History and Physical Interval Note:  07/19/2022 7:47 AM  Diana Wilkins  has presented today for surgery, with the diagnosis of EPIGASTRIC PAIN, WEIGHT LOSS,BOWEL HABIT CHANGES, DYSPEPSIA, BARRETT'S ESOPHAGUS.  The various methods of treatment have been discussed with the patient and family. After consideration of risks, benefits and other options for treatment, the patient has consented to  Procedure(s): ESOPHAGOGASTRODUODENOSCOPY (EGD) WITH PROPOFOL (N/A) COLONOSCOPY WITH PROPOFOL (N/A) as a surgical intervention.  The patient's history has been reviewed, patient examined, no change in status, stable for surgery.  I have reviewed the patient's chart and labs.  Questions were answered to the patient's satisfaction.     Lesly Rubenstein  Ok to proceed with EGD/Colonoscopy

## 2022-07-19 NOTE — Anesthesia Postprocedure Evaluation (Signed)
Anesthesia Post Note  Patient: EMANII BUGBEE  Procedure(s) Performed: ESOPHAGOGASTRODUODENOSCOPY (EGD) WITH PROPOFOL COLONOSCOPY WITH PROPOFOL  Patient location during evaluation: PACU Anesthesia Type: General Level of consciousness: awake Pain management: pain level controlled Respiratory status: spontaneous breathing and nonlabored ventilation Cardiovascular status: stable Anesthetic complications: no  No notable events documented.   Last Vitals:  Vitals:   07/19/22 0813 07/19/22 0823  BP: (!) 120/58 127/62  Pulse: 74 69  Resp: (!) 23 12  Temp:    SpO2: 100% 100%    Last Pain:  Vitals:   07/19/22 0823  TempSrc:   PainSc: 0-No pain                 VAN STAVEREN,Lexandra Rettke

## 2022-07-19 NOTE — H&P (Signed)
Outpatient short stay form Pre-procedure 07/19/2022  Diana Rubenstein, MD  Primary Physician: Sallee Lange, NP  Reason for visit:  Epigastric pain/Abdominal pain  History of present illness:    66 y/o lady with IBS-C and hypertension here for EGD/Colonoscopy for epigastric pain and abdominal pain. Last colonoscopy in 2019 unremarkable. No blood thinners. No family history of GI malignancies. History of cholecystectomy.    Current Facility-Administered Medications:    0.9 %  sodium chloride infusion, , Intravenous, Continuous, Lynnda Wiersma, Hilton Cork, MD, Last Rate: 20 mL/hr at 07/19/22 0721, New Bag at 07/19/22 0721  Medications Prior to Admission  Medication Sig Dispense Refill Last Dose   docusate sodium (COLACE) 100 MG capsule Take 1 tablet once or twice daily as needed for constipation while taking narcotic pain medicine 30 capsule 0 07/18/2022   omeprazole (PRILOSEC) 40 MG capsule Take 40 mg by mouth 2 (two) times daily.    07/18/2022   omeprazole (PRILOSEC) 40 MG capsule Take 1 capsule (40 mg total) by mouth daily. 30 capsule 0 07/18/2022   oxybutynin (DITROPAN-XL) 5 MG 24 hr tablet Take 5 mg by mouth 2 (two) times daily.   07/18/2022   pantoprazole (PROTONIX) 40 MG tablet Take 40 mg by mouth daily.   07/18/2022   Cholecalciferol (VITAMIN D-3) 1000 units CAPS Take 1 capsule by mouth daily.      dicyclomine (BENTYL) 20 MG tablet Take 20 mg by mouth every 6 (six) hours.      ondansetron (ZOFRAN ODT) 4 MG disintegrating tablet Allow 1-2 tablets to dissolve in your mouth every 8 hours as needed for nausea/vomiting 30 tablet 0    ondansetron (ZOFRAN-ODT) 4 MG disintegrating tablet Take 1 tablet (4 mg total) by mouth every 8 (eight) hours as needed. 20 tablet 0      Allergies  Allergen Reactions   Erythromycin Nausea Only   Ciprofloxacin In D5w Rash     Past Medical History:  Diagnosis Date   GERD (gastroesophageal reflux disease)    Headache    H/O MIGRAINES    Hyperlipidemia    Hypertension    Stress incontinence     Review of systems:  Otherwise negative.    Physical Exam  Gen: Alert, oriented. Appears stated age.  HEENT: PERRLA. Lungs: No respiratory distress CV: RRR Abd: soft, benign, no masses Ext: No edema    Planned procedures: Proceed with EGD/colonoscopy. The patient understands the nature of the planned procedure, indications, risks, alternatives and potential complications including but not limited to bleeding, infection, perforation, damage to internal organs and possible oversedation/side effects from anesthesia. The patient agrees and gives consent to proceed.  Please refer to procedure notes for findings, recommendations and patient disposition/instructions.     Diana Rubenstein, MD [facility]

## 2022-07-19 NOTE — Op Note (Signed)
[facility] Patient Name: Diana Wilkins Procedure Date: 07/19/2022 7:17 AM MRN: [id number] Account #: [id number] Date of Birth: May 01, 1956 Admit Type: Outpatient Age: 66 Room: [facility] ENDO ROOM 1 Gender: Female Note Status: Finalized Instrument Name: Upper Endoscope [id number] Procedure:             Upper GI endoscopy Indications:           Epigastric abdominal pain, Dyspepsia Providers:             Andrey Farmer MD, MD Referring MD:          Juluis Rainier (Referring MD) Medicines:             Monitored Anesthesia Care Complications:         No immediate complications. Estimated blood loss:                         Minimal. Procedure:             Pre-Anesthesia Assessment:                        - Prior to the procedure, a History and Physical was                         performed, and patient medications and allergies were                         reviewed. The patient is competent. The risks and                         benefits of the procedure and the sedation options and                         risks were discussed with the patient. All questions                         were answered and informed consent was obtained.                         Patient identification and proposed procedure were                         verified by the physician, the nurse, the                         anesthesiologist, the anesthetist and the technician                         in the endoscopy suite. Mental Status Examination:                         alert and oriented. Airway Examination: normal                         oropharyngeal airway and neck mobility. Respiratory                         Examination: clear to auscultation. CV Examination:  normal. Prophylactic Antibiotics: The patient does not                         require prophylactic antibiotics. Prior                         Anticoagulants: The patient has taken no anticoagulant                          or antiplatelet agents. ASA Grade Assessment: II - A                         patient with mild systemic disease. After reviewing                         the risks and benefits, the patient was deemed in                         satisfactory condition to undergo the procedure. The                         anesthesia plan was to use monitored anesthesia care                         (MAC). Immediately prior to administration of                         medications, the patient was re-assessed for adequacy                         to receive sedatives. The heart rate, respiratory                         rate, oxygen saturations, blood pressure, adequacy of                         pulmonary ventilation, and response to care were                         monitored throughout the procedure. The physical                         status of the patient was re-assessed after the                         procedure.                        After obtaining informed consent, the endoscope was                         passed under direct vision. Throughout the procedure,                         the patient's blood pressure, pulse, and oxygen                         saturations were monitored continuously. The Endoscope  was introduced through the mouth, and advanced to the                         second part of duodenum. The upper GI endoscopy was                         accomplished without difficulty. The patient tolerated                         the procedure well. Findings:      The examined esophagus was normal.      Patchy mild inflammation characterized by adherent blood and erythema       was found in the stomach. Biopsies were taken with a cold forceps for       Helicobacter pylori testing. Estimated blood loss was minimal.      There was a medium-sized lipoma in the second portion of the duodenum.      There was a small lipoma in the second portion of the  duodenum.      The exam of the duodenum was otherwise normal. Impression:            - Normal esophagus.                        - Gastritis. Biopsied.                        - Duodenal lipoma.                        - Duodenal lipoma. Recommendation:        - Await pathology results.                        - Perform a colonoscopy today. Procedure Code(s):     --- Professional ---                        [phone], Esophagogastroduodenoscopy, flexible,                         transoral; with biopsy, single or multiple Diagnosis Code(s):     --- Professional ---                        K29.70, Gastritis, unspecified, without bleeding                        D17.5, Benign lipomatous neoplasm of intra-abdominal                         organs                        R10.13, Epigastric pain CPT copyright 2022 [employer] Association. All rights reserved. The codes documented in this report are preliminary and upon coder review may  be revised to meet current compliance requirements. Andrey Farmer MD, MD 07/19/2022 8:04:30 AM Number of Addenda: 0 Note Initiated On: 07/19/2022 7:17 AM Estimated Blood Loss:  Estimated blood loss was minimal.      [facility]

## 2022-07-19 NOTE — Transfer of Care (Signed)
Immediate Anesthesia Transfer of Care Note  Patient: Diana Wilkins  Procedure(s) Performed: ESOPHAGOGASTRODUODENOSCOPY (EGD) WITH PROPOFOL COLONOSCOPY WITH PROPOFOL  Patient Location: PACU  Anesthesia Type:General  Level of Consciousness: drowsy  Airway & Oxygen Therapy: Patient Spontanous Breathing and Patient connected to face mask oxygen  Post-op Assessment: Report given to RN and Post -op Vital signs reviewed and stable  Post vital signs: Reviewed and stable  Last Vitals:  Vitals Value Taken Time  BP 103/59 07/19/22 0803  Temp 36.1 C 07/19/22 0803  Pulse 70 07/19/22 0803  Resp 20 07/19/22 0803  SpO2 100 % 07/19/22 0803    Last Pain:  Vitals:   07/19/22 0803  TempSrc: Temporal  PainSc: Asleep         Complications: No notable events documented.

## 2022-07-19 NOTE — Anesthesia Preprocedure Evaluation (Signed)
Anesthesia Evaluation  Patient identified by MRN, date of birth, ID band Patient awake    Reviewed: Allergy & Precautions, NPO status , Patient's Chart, lab work & pertinent test results  Airway Mallampati: II  TM Distance: >3 FB Neck ROM: full    Dental  (+) Edentulous Upper, Edentulous Lower   Pulmonary neg pulmonary ROS, former smoker   Pulmonary exam normal breath sounds clear to auscultation       Cardiovascular Exercise Tolerance: Good hypertension, negative cardio ROS Normal cardiovascular exam Rhythm:Regular Rate:Normal     Neuro/Psych  Headaches negative neurological ROS  negative psych ROS   GI/Hepatic negative GI ROS, Neg liver ROS,GERD  Medicated,,  Endo/Other  negative endocrine ROS    Renal/GU negative Renal ROS  negative genitourinary   Musculoskeletal   Abdominal Normal abdominal exam  (+)   Peds negative pediatric ROS (+)  Hematology negative hematology ROS (+)   Anesthesia Other Findings Past Medical History: No date: GERD (gastroesophageal reflux disease) No date: Headache     Comment:  H/O MIGRAINES No date: Hyperlipidemia No date: Hypertension No date: Stress incontinence  Past Surgical History: No date: BREAST EXCISIONAL BIOPSY; Bilateral     Comment:  neg 01/19/2016: CHOLECYSTECTOMY; N/A     Comment:  Procedure: LAPAROSCOPIC CHOLECYSTECTOMY WITH               INTRAOPERATIVE CHOLANGIOGRAM;  Surgeon: Clayburn Pert,               MD;  Location: [facility] ORS;  Service: General;  Laterality:               N/A; 09/07/2006: COLONOSCOPY 12/26/2017: COLONOSCOPY WITH PROPOFOL; N/A     Comment:  Procedure: COLONOSCOPY WITH PROPOFOL;  Surgeon:               Lollie Sails, MD;  Location: [facility] ENDOSCOPY;                Service: Endoscopy;  Laterality: N/A; 07/21/2014: ESOPHAGOGASTRODUODENOSCOPY 12/26/2017: ESOPHAGOGASTRODUODENOSCOPY (EGD) WITH PROPOFOL; N/A     Comment:  Procedure:  ESOPHAGOGASTRODUODENOSCOPY (EGD) WITH               PROPOFOL;  Surgeon: Lollie Sails, MD;  Location:               [facility] ENDOSCOPY;  Service: Endoscopy;  Laterality: N/A; 05/10/2019: ESOPHAGOGASTRODUODENOSCOPY (EGD) WITH PROPOFOL; N/A     Comment:  Procedure: ESOPHAGOGASTRODUODENOSCOPY (EGD) WITH               PROPOFOL;  Surgeon: Lollie Sails, MD;  Location:               [facility] ENDOSCOPY;  Service: Endoscopy;  Laterality: N/A; 1970: TONSILLECTOMY No date: TUBAL LIGATION  BMI    Body Mass Index: 23.80 kg/m      Reproductive/Obstetrics negative OB ROS                             Anesthesia Physical Anesthesia Plan  ASA: 2  Anesthesia Plan: General   Post-op Pain Management:    Induction: Intravenous  PONV Risk Score and Plan: Propofol infusion and TIVA  Airway Management Planned: Natural Airway  Additional Equipment:   Intra-op Plan:   Post-operative Plan:   Informed Consent: I have reviewed the patients History and Physical, chart, labs and discussed the procedure including the risks, benefits and alternatives for the proposed anesthesia with the patient or authorized  representative who has indicated his/her understanding and acceptance.     Dental Advisory Given  Plan Discussed with: CRNA and Surgeon  Anesthesia Plan Comments:        Anesthesia Quick Evaluation

## 2022-07-19 NOTE — Op Note (Signed)
[facility] Patient Name: Diana Wilkins Procedure Date: 07/19/2022 7:17 AM MRN: [id number] Account #: [id number] Date of Birth: [birth year] Admit Type: Outpatient Age: 66 Room: [facility] ENDO ROOM 1 Gender: Female Note Status: Finalized Instrument Name: Park Meo [id number] Procedure:             Colonoscopy Indications:           Generalized abdominal pain Providers:             Andrey Farmer MD, MD Referring MD:          Juluis Rainier (Referring MD) Medicines:             Monitored Anesthesia Care Complications:         No immediate complications. Procedure:             Pre-Anesthesia Assessment:                        - Prior to the procedure, a History and Physical was                         performed, and patient medications and allergies were                         reviewed. The patient is competent. The risks and                         benefits of the procedure and the sedation options and                         risks were discussed with the patient. All questions                         were answered and informed consent was obtained.                         Patient identification and proposed procedure were                         verified by the physician, the nurse, the                         anesthesiologist, the anesthetist and the technician                         in the endoscopy suite. Mental Status Examination:                         alert and oriented. Airway Examination: normal                         oropharyngeal airway and neck mobility. Respiratory                         Examination: clear to auscultation. CV Examination:                         normal. Prophylactic Antibiotics: The patient does not  require prophylactic antibiotics. Prior                         Anticoagulants: The patient has taken no anticoagulant                         or antiplatelet agents. ASA Grade Assessment: II - A                          patient with mild systemic disease. After reviewing                         the risks and benefits, the patient was deemed in                         satisfactory condition to undergo the procedure. The                         anesthesia plan was to use monitored anesthesia care                         (MAC). Immediately prior to administration of                         medications, the patient was re-assessed for adequacy                         to receive sedatives. The heart rate, respiratory                         rate, oxygen saturations, blood pressure, adequacy of                         pulmonary ventilation, and response to care were                         monitored throughout the procedure. The physical                         status of the patient was re-assessed after the                         procedure.                        After obtaining informed consent, the colonoscope was                         passed under direct vision. Throughout the procedure,                         the patient's blood pressure, pulse, and oxygen                         saturations were monitored continuously. The                         Colonoscope was introduced through the anus with the  intention of advancing to the cecum. The scope was                         advanced to the sigmoid colon before the procedure was                         aborted. Medications were given. The colonoscopy was                         aborted due to poor bowel prep. Findings:      The perianal and digital rectal examinations were normal.      Semi-solid stool was found in the recto-sigmoid colon, precluding       visualization. Impression:            - The procedure was aborted due to poor bowel prep.                        - Stool in the recto-sigmoid colon.                        - No specimens collected. Recommendation:        - Discharge patient to home.                         - Resume previous diet.                        - Continue present medications.                        - Repeat colonoscopy at the next available appointment                         because the bowel preparation was suboptimal.                        - Return to referring physician as previously                         scheduled. Procedure Code(s):     --- Professional ---                        [phone], 53, Colonoscopy, flexible; diagnostic,                         including collection of specimen(s) by brushing or                         washing, when performed (separate procedure) Diagnosis Code(s):     --- Professional ---                        R10.84, Generalized abdominal pain CPT copyright 2022 American Medical Association. All rights reserved. The codes documented in this report are preliminary and upon coder review may  be revised to meet current compliance requirements. Andrey Farmer MD, MD 07/19/2022 8:07:29 AM Number of Addenda: 0 Note Initiated On: 07/19/2022 7:17 AM Total Procedure Duration: 0 hours 1 minute 24 seconds  Estimated Blood Loss:  Estimated blood loss: none.  [facility]

## 2022-07-20 ENCOUNTER — Encounter: Payer: Self-pay | Admitting: Gastroenterology

## 2022-07-22 LAB — SURGICAL PATHOLOGY

## 2022-09-01 ENCOUNTER — Ambulatory Visit
Admission: RE | Admit: 2022-09-01 | Discharge: 2022-09-01 | Disposition: A | Discharge status: Home / Self Care | Payer: Medicare HMO | Source: Ambulatory Visit | Attending: Nurse Practitioner | Admitting: Nurse Practitioner

## 2022-09-01 DIAGNOSIS — Z1231 Encounter for screening mammogram for malignant neoplasm of breast: Secondary | ICD-10-CM | POA: Insufficient documentation

## 2022-10-05 ENCOUNTER — Ambulatory Visit
Admission: RE | Admit: 2022-10-05 | Discharge: 2022-10-05 | Disposition: A | Discharge status: Home / Self Care | Payer: Medicare HMO | Source: Ambulatory Visit | Attending: Acute Care | Admitting: Acute Care

## 2022-10-05 DIAGNOSIS — F17210 Nicotine dependence, cigarettes, uncomplicated: Secondary | ICD-10-CM | POA: Diagnosis present

## 2022-10-05 DIAGNOSIS — Z87891 Personal history of nicotine dependence: Secondary | ICD-10-CM | POA: Diagnosis not present

## 2022-10-07 ENCOUNTER — Other Ambulatory Visit: Payer: Self-pay

## 2022-10-07 ENCOUNTER — Telehealth: Payer: Self-pay | Admitting: Acute Care

## 2022-10-07 DIAGNOSIS — Z87891 Personal history of nicotine dependence: Secondary | ICD-10-CM

## 2022-10-07 DIAGNOSIS — Z122 Encounter for screening for malignant neoplasm of respiratory organs: Secondary | ICD-10-CM

## 2022-10-07 DIAGNOSIS — F17210 Nicotine dependence, cigarettes, uncomplicated: Secondary | ICD-10-CM

## 2022-10-07 NOTE — Telephone Encounter (Signed)
Results of LDCT reviewed with patient by phone, using two patient identifiers.  No suspicious findings for lung cancer.  Atherosclerosis and emphysema, as previously noted. Patient states she is on statin medication. Thyroid nodule noted with recommendation for follow up ultrasound.  Patient states she is unaware of any previous concerns with thyroid.  Faxed results with note to review the thyroid nodule recommendation on report.  Patient advised if she has not heard from PCP in a couple of weeks, to please contact their office for further recommendations by PCP.  Patient acknowledged understanding.  Order placed for annual LDCT. Unable to attach PCP to this note but results faxed to office

## 2022-10-10 ENCOUNTER — Other Ambulatory Visit: Payer: Self-pay | Admitting: Nurse Practitioner

## 2022-10-10 DIAGNOSIS — E041 Nontoxic single thyroid nodule: Secondary | ICD-10-CM

## 2022-10-19 ENCOUNTER — Ambulatory Visit
Admission: RE | Admit: 2022-10-19 | Discharge: 2022-10-19 | Disposition: A | Discharge status: Home / Self Care | Payer: Medicare HMO | Source: Ambulatory Visit | Attending: Nurse Practitioner | Admitting: Nurse Practitioner

## 2022-10-19 DIAGNOSIS — E041 Nontoxic single thyroid nodule: Secondary | ICD-10-CM | POA: Diagnosis not present

## 2022-11-14 ENCOUNTER — Encounter
Admission: RE | Disposition: A | Discharge status: Home / Self Care | Payer: Self-pay | Source: Home / Self Care | Attending: Gastroenterology

## 2022-11-14 ENCOUNTER — Ambulatory Visit: Payer: Medicare HMO | Admitting: Registered Nurse

## 2022-11-14 ENCOUNTER — Encounter: Payer: Self-pay | Admitting: *Deleted

## 2022-11-14 ENCOUNTER — Ambulatory Visit
Admission: RE | Admit: 2022-11-14 | Discharge: 2022-11-14 | Disposition: A | Discharge status: Home / Self Care | Payer: Medicare HMO | Attending: Gastroenterology | Admitting: Gastroenterology

## 2022-11-14 DIAGNOSIS — K219 Gastro-esophageal reflux disease without esophagitis: Secondary | ICD-10-CM | POA: Diagnosis not present

## 2022-11-14 DIAGNOSIS — Z9049 Acquired absence of other specified parts of digestive tract: Secondary | ICD-10-CM | POA: Diagnosis not present

## 2022-11-14 DIAGNOSIS — I10 Essential (primary) hypertension: Secondary | ICD-10-CM | POA: Insufficient documentation

## 2022-11-14 DIAGNOSIS — E785 Hyperlipidemia, unspecified: Secondary | ICD-10-CM | POA: Diagnosis not present

## 2022-11-14 DIAGNOSIS — K589 Irritable bowel syndrome without diarrhea: Secondary | ICD-10-CM | POA: Diagnosis not present

## 2022-11-14 DIAGNOSIS — R1084 Generalized abdominal pain: Secondary | ICD-10-CM | POA: Insufficient documentation

## 2022-11-14 HISTORY — PX: COLONOSCOPY WITH PROPOFOL: SHX5780

## 2022-11-14 SURGERY — COLONOSCOPY WITH PROPOFOL
Anesthesia: General

## 2022-11-14 MED ORDER — PROPOFOL 500 MG/50ML IV EMUL
INTRAVENOUS | Status: DC | PRN
  Administered 2022-11-14: 150 ug/kg/min via INTRAVENOUS

## 2022-11-14 MED ORDER — SODIUM CHLORIDE 0.9 % IV SOLN: INTRAVENOUS | Status: DC

## 2022-11-14 MED ORDER — PROPOFOL 10 MG/ML IV BOLUS
INTRAVENOUS | Status: DC | PRN
  Administered 2022-11-14: 70 mg via INTRAVENOUS

## 2022-11-14 MED ORDER — LIDOCAINE HCL (CARDIAC) PF 100 MG/5ML IV SOSY
PREFILLED_SYRINGE | INTRAVENOUS | Status: DC | PRN
  Administered 2022-11-14: 50 mg via INTRAVENOUS

## 2022-11-14 NOTE — H&P (Signed)
Outpatient short stay form Pre-procedure 11/14/2022  Diana Rubenstein, MD  Primary Physician: Sallee Lange, NP  Reason for visit:  Abdominal pain  History of present illness:    67 y/o lady with history of IBS-C and hypertension here for colonoscopy for abdominal pain. No blood thinners. No family history of GI malignancies. History of cholecystectomy.    Current Facility-Administered Medications:    0.9 %  sodium chloride infusion, , Intravenous, Continuous, Renda Pohlman, Hilton Cork, MD  Medications Prior to Admission  Medication Sig Dispense Refill Last Dose   Cholecalciferol (VITAMIN D-3) 1000 units CAPS Take 1 capsule by mouth daily.   Past Week   dicyclomine (BENTYL) 20 MG tablet Take 20 mg by mouth every 6 (six) hours.   11/13/2022   docusate sodium (COLACE) 100 MG capsule Take 1 tablet once or twice daily as needed for constipation while taking narcotic pain medicine 30 capsule 0 Past Week   oxybutynin (DITROPAN-XL) 5 MG 24 hr tablet Take 5 mg by mouth 2 (two) times daily.   11/13/2022   pantoprazole (PROTONIX) 40 MG tablet Take 40 mg by mouth daily.   11/13/2022   omeprazole (PRILOSEC) 40 MG capsule Take 40 mg by mouth 2 (two) times daily.  (Patient not taking: Reported on 11/14/2022)   Not Taking   omeprazole (PRILOSEC) 40 MG capsule Take 1 capsule (40 mg total) by mouth daily. (Patient not taking: Reported on 11/14/2022) 30 capsule 0 Not Taking   ondansetron (ZOFRAN ODT) 4 MG disintegrating tablet Allow 1-2 tablets to dissolve in your mouth every 8 hours as needed for nausea/vomiting 30 tablet 0  at prn   ondansetron (ZOFRAN-ODT) 4 MG disintegrating tablet Take 1 tablet (4 mg total) by mouth every 8 (eight) hours as needed. 20 tablet 0      Allergies  Allergen Reactions   Erythromycin Nausea Only   Ciprofloxacin In D5w Rash     Past Medical History:  Diagnosis Date   GERD (gastroesophageal reflux disease)    Headache    H/O MIGRAINES   Hyperlipidemia     Hypertension    Stress incontinence     Review of systems:  Otherwise negative.    Physical Exam  Gen: Alert, oriented. Appears stated age.  HEENT: PERRLA. Lungs: No respiratory distress CV: RRR Abd: soft, benign, no masses Ext: No edema    Planned procedures: Proceed with colonoscopy. The patient understands the nature of the planned procedure, indications, risks, alternatives and potential complications including but not limited to bleeding, infection, perforation, damage to internal organs and possible oversedation/side effects from anesthesia. The patient agrees and gives consent to proceed.  Please refer to procedure notes for findings, recommendations and patient disposition/instructions.     Diana Rubenstein, MD [facility]enter[facility]

## 2022-11-14 NOTE — Brief Op Note (Signed)
Colonoscopy aborted due to poor prep

## 2022-11-14 NOTE — Anesthesia Postprocedure Evaluation (Signed)
Anesthesia Post Note  Patient: Diana Wilkins  Procedure(s) Performed: COLONOSCOPY WITH PROPOFOL  Patient location during evaluation: Endoscopy Anesthesia Type: General Level of consciousness: awake and alert Pain management: pain level controlled Vital Signs Assessment: post-procedure vital signs reviewed and stable Respiratory status: spontaneous breathing, nonlabored ventilation, respiratory function stable and patient connected to nasal cannula oxygen Cardiovascular status: blood pressure returned to baseline and stable Postop Assessment: no apparent nausea or vomiting Anesthetic complications: no   No notable events documented.   Last Vitals:  Vitals:   11/14/22 1313 11/14/22 1323  BP: 127/60 138/67  Pulse: 82 79  Resp: (!) 25 (!) 27  Temp: (!) 35.8 C   SpO2: 100% 100%    Last Pain:  Vitals:   11/14/22 1323  TempSrc:   PainSc: 0-No pain                 Ilene Qua

## 2022-11-14 NOTE — Interval H&P Note (Signed)
History and Physical Interval Note:  11/14/2022 12:57 PM  Diana Wilkins  has presented today for surgery, with the diagnosis of Epigastric pain; Weight loss; Bowel habit changes.  The various methods of treatment have been discussed with the patient and family. After consideration of risks, benefits and other options for treatment, the patient has consented to  Procedure(s): COLONOSCOPY WITH PROPOFOL (N/A) as a surgical intervention.  The patient's history has been reviewed, patient examined, no change in status, stable for surgery.  I have reviewed the patient's chart and labs.  Questions were answered to the patient's satisfaction.     Lesly Rubenstein  Ok to proceed with colonoscopy

## 2022-11-14 NOTE — Transfer of Care (Signed)
Immediate Anesthesia Transfer of Care Note  Patient: Diana Wilkins  Procedure(s) Performed: COLONOSCOPY WITH PROPOFOL  Patient Location: PACU and Endoscopy Unit  Anesthesia Type:General  Level of Consciousness: sedated and patient cooperative  Airway & Oxygen Therapy: Patient Spontanous Breathing and Patient connected to nasal cannula oxygen  Post-op Assessment: Report given to RN and Patient moving all extremities X 4  Post vital signs: Reviewed and stable  Last Vitals:  Vitals Value Taken Time  BP 127/60 11/14/22 1313  Temp 35.8 C 11/14/22 1313  Pulse 82 11/14/22 1314  Resp 26 11/14/22 1314  SpO2 100 % 11/14/22 1314  Vitals shown include unvalidated device data.  Last Pain:  Vitals:   11/14/22 1313  TempSrc: Temporal  PainSc: Asleep         Complications: No notable events documented.

## 2022-11-14 NOTE — Anesthesia Preprocedure Evaluation (Signed)
Anesthesia Evaluation  Patient identified by MRN, date of birth, ID band Patient awake    Reviewed: Allergy & Precautions, NPO status , Patient's Chart, lab work & pertinent test results  History of Anesthesia Complications Negative for: history of anesthetic complications  Airway Mallampati: III  TM Distance: >3 FB Neck ROM: full    Dental no notable dental hx.    Pulmonary neg pulmonary ROS, former smoker   Pulmonary exam normal        Cardiovascular hypertension, On Medications negative cardio ROS Normal cardiovascular exam     Neuro/Psych  Headaches  negative psych ROS   GI/Hepatic Neg liver ROS,GERD  Medicated,,  Endo/Other    Renal/GU negative Renal ROS  negative genitourinary   Musculoskeletal   Abdominal   Peds  Hematology negative hematology ROS (+)   Anesthesia Other Findings Past Medical History: No date: GERD (gastroesophageal reflux disease) No date: Headache     Comment:  H/O MIGRAINES No date: Hyperlipidemia No date: Hypertension No date: Stress incontinence  Past Surgical History: No date: BREAST EXCISIONAL BIOPSY; Bilateral     Comment:  neg No date: BREAST SURGERY 01/19/2016: CHOLECYSTECTOMY; N/A     Comment:  Procedure: LAPAROSCOPIC CHOLECYSTECTOMY WITH               INTRAOPERATIVE CHOLANGIOGRAM;  Surgeon: Clayburn Pert,               MD;  Location: [facility] ORS;  Service: General;  Laterality:               N/A; 09/07/2006: COLONOSCOPY 12/26/2017: COLONOSCOPY WITH PROPOFOL; N/A     Comment:  Procedure: COLONOSCOPY WITH PROPOFOL;  Surgeon:               Lollie Sails, MD;  Location: [facility] ENDOSCOPY;                Service: Endoscopy;  Laterality: N/A; 07/19/2022: COLONOSCOPY WITH PROPOFOL; N/A     Comment:  Procedure: COLONOSCOPY WITH PROPOFOL;  Surgeon:               Lesly Rubenstein, MD;  Location: [facility] ENDOSCOPY;                Service: Endoscopy;  Laterality:  N/A; 07/21/2014: ESOPHAGOGASTRODUODENOSCOPY 12/26/2017: ESOPHAGOGASTRODUODENOSCOPY (EGD) WITH PROPOFOL; N/A     Comment:  Procedure: ESOPHAGOGASTRODUODENOSCOPY (EGD) WITH               PROPOFOL;  Surgeon: Lollie Sails, MD;  Location:               [facility] ENDOSCOPY;  Service: Endoscopy;  Laterality: N/A; 05/10/2019: ESOPHAGOGASTRODUODENOSCOPY (EGD) WITH PROPOFOL; N/A     Comment:  Procedure: ESOPHAGOGASTRODUODENOSCOPY (EGD) WITH               PROPOFOL;  Surgeon: Lollie Sails, MD;  Location:               [facility] ENDOSCOPY;  Service: Endoscopy;  Laterality: N/A; 07/19/2022: ESOPHAGOGASTRODUODENOSCOPY (EGD) WITH PROPOFOL; N/A     Comment:  Procedure: ESOPHAGOGASTRODUODENOSCOPY (EGD) WITH               PROPOFOL;  Surgeon: Lesly Rubenstein, MD;  Location:               [facility] ENDOSCOPY;  Service: Endoscopy;  Laterality: N/A; 1970: TONSILLECTOMY No date: TUBAL LIGATION  BMI    Body Mass Index: 22.19 kg/m      Reproductive/Obstetrics negative OB ROS  Anesthesia Physical Anesthesia Plan  ASA: 2  Anesthesia Plan: General   Post-op Pain Management: Minimal or no pain anticipated   Induction: Intravenous  PONV Risk Score and Plan: Propofol infusion and TIVA  Airway Management Planned: Natural Airway and Nasal Cannula  Additional Equipment:   Intra-op Plan:   Post-operative Plan:   Informed Consent: I have reviewed the patients History and Physical, chart, labs and discussed the procedure including the risks, benefits and alternatives for the proposed anesthesia with the patient or authorized representative who has indicated his/her understanding and acceptance.     Dental Advisory Given  Plan Discussed with: Anesthesiologist, CRNA and Surgeon  Anesthesia Plan Comments: (Patient consented for risks of anesthesia including but not limited to:  - adverse reactions to medications - risk of airway placement if  required - damage to eyes, teeth, lips or other oral mucosa - nerve damage due to positioning  - sore throat or hoarseness - Damage to heart, brain, nerves, lungs, other parts of body or loss of life  Patient voiced understanding.)       Anesthesia Quick Evaluation

## 2022-11-14 NOTE — Op Note (Signed)
[facility]astroenter[facility] Patient Name: Diana Wilkins Procedure Date: 11/14/2022 12:46 PM MRN: [id number] Account #: [id number] Date of Birth: May 14, 1956 Admit Type: Outpatient Age: 67 Room: [facility] ENDO ROOM 3 Gender: Female Note Status: Finalized Instrument Name: Jasper Riling [id number] Procedure:             Colonoscopy Indications:           Generalized abdominal pain Providers:             Andrey Farmer MD, MD Referring MD:          Juluis Rainier (Referring MD) Medicines:             Monitored Anesthesia Care Complications:         No immediate complications. Procedure:             Pre-Anesthesia Assessment:                        - Prior to the procedure, a History and Physical was                         performed, and patient medications and allergies were                         reviewed. The patient is competent. The risks and                         benefits of the procedure and the sedation options and                         risks were discussed with the patient. All questions                         were answered and informed consent was obtained.                         Patient identification and proposed procedure were                         verified by the physician, the nurse, the                         anesthesiologist, the anesthetist and the technician                         in the endoscopy suite. Mental Status Examination:                         alert and oriented. Airway Examination: normal                         oropharyngeal airway and neck mobility. Respiratory                         Examination: clear to auscultation. CV Examination:                         normal. Prophylactic Antibiotics: The patient does not  require prophylactic antibiotics. Prior                         Anticoagulants: The patient has taken no anticoagulant                         or antiplatelet agents. ASA Grade Assessment: II - A                          patient with mild systemic disease. After reviewing                         the risks and benefits, the patient was deemed in                         satisfactory condition to undergo the procedure. The                         anesthesia plan was to use monitored anesthesia care                         (MAC). Immediately prior to administration of                         medications, the patient was re-assessed for adequacy                         to receive sedatives. The heart rate, respiratory                         rate, oxygen saturations, blood pressure, adequacy of                         pulmonary ventilation, and response to care were                         monitored throughout the procedure. The physical                         status of the patient was re-assessed after the                         procedure.                        After obtaining informed consent, the colonoscope was                         passed under direct vision. Throughout the procedure,                         the patient's blood pressure, pulse, and oxygen                         saturations were monitored continuously. The                         Colonoscope was introduced through the anus with the  intention of advancing to the cecum. The scope was                         advanced to the sigmoid colon before the procedure was                         aborted. Medications were given. The colonoscopy was                         aborted due to poor bowel prep with stool present. The                         patient tolerated the procedure well. The quality of                         the bowel preparation was poor. No anatomical                         landmarks were photographed. Findings:      The perianal and digital rectal examinations were normal.      A large amount of stool was found in the recto-sigmoid colon, precluding       visualization. Impression:             - The procedure was aborted due to poor bowel prep                         with stool present.                        - Preparation of the colon was poor.                        - Stool in the recto-sigmoid colon.                        - No specimens collected. Recommendation:        - Discharge patient to home.                        - Resume previous diet.                        - Continue present medications.                        - Repeat colonoscopy at appointment to be scheduled                         because the bowel preparation was suboptimal.                        - Return to referring physician as previously                         scheduled. Procedure Code(s):     --- Professional ---                        [phone], 59, Colonoscopy, flexible; diagnostic,  including collection of specimen(s) by brushing or                         washing, when performed (separate procedure) Diagnosis Code(s):     --- Professional ---                        R10.84, Generalized abdominal pain CPT copyright 2022 American Medical Association. All rights reserved. The codes documented in this report are preliminary and upon coder review may  be revised to meet current compliance requirements. Andrey Farmer MD, MD 11/14/2022 1:11:10 PM Number of Addenda: 0 Note Initiated On: 11/14/2022 12:46 PM Total Procedure Duration: 0 hours 1 minute 33 seconds  Estimated Blood Loss:  Estimated blood loss: none.      [facility]

## 2022-11-15 ENCOUNTER — Encounter: Payer: Self-pay | Admitting: Gastroenterology

## 2023-07-18 ENCOUNTER — Other Ambulatory Visit: Payer: Self-pay | Admitting: Nurse Practitioner

## 2023-07-18 DIAGNOSIS — Z1231 Encounter for screening mammogram for malignant neoplasm of breast: Secondary | ICD-10-CM

## 2023-07-21 ENCOUNTER — Ambulatory Visit: Payer: Medicare HMO | Admitting: Anesthesiology

## 2023-07-21 ENCOUNTER — Encounter
Admission: RE | Disposition: A | Discharge status: Home / Self Care | Payer: Self-pay | Source: Home / Self Care | Attending: Gastroenterology

## 2023-07-21 ENCOUNTER — Other Ambulatory Visit: Payer: Self-pay

## 2023-07-21 ENCOUNTER — Encounter: Payer: Self-pay | Admitting: *Deleted

## 2023-07-21 ENCOUNTER — Ambulatory Visit
Admission: RE | Admit: 2023-07-21 | Discharge: 2023-07-21 | Disposition: A | Discharge status: Home / Self Care | Payer: Medicare HMO | Attending: Gastroenterology | Admitting: Gastroenterology

## 2023-07-21 DIAGNOSIS — K640 First degree hemorrhoids: Secondary | ICD-10-CM | POA: Diagnosis not present

## 2023-07-21 DIAGNOSIS — I10 Essential (primary) hypertension: Secondary | ICD-10-CM | POA: Diagnosis not present

## 2023-07-21 DIAGNOSIS — K635 Polyp of colon: Secondary | ICD-10-CM | POA: Insufficient documentation

## 2023-07-21 DIAGNOSIS — R1084 Generalized abdominal pain: Secondary | ICD-10-CM | POA: Diagnosis present

## 2023-07-21 DIAGNOSIS — Z87891 Personal history of nicotine dependence: Secondary | ICD-10-CM | POA: Insufficient documentation

## 2023-07-21 DIAGNOSIS — Z9049 Acquired absence of other specified parts of digestive tract: Secondary | ICD-10-CM | POA: Diagnosis not present

## 2023-07-21 HISTORY — PX: POLYPECTOMY: SHX5525

## 2023-07-21 HISTORY — PX: COLONOSCOPY WITH PROPOFOL: SHX5780

## 2023-07-21 SURGERY — COLONOSCOPY WITH PROPOFOL
Anesthesia: General

## 2023-07-21 MED ORDER — PROPOFOL 10 MG/ML IV BOLUS
INTRAVENOUS | Status: DC | PRN
  Administered 2023-07-21: 10 mg via INTRAVENOUS
  Administered 2023-07-21: 50 mg via INTRAVENOUS
  Administered 2023-07-21: 20 mg via INTRAVENOUS

## 2023-07-21 MED ORDER — LIDOCAINE HCL (PF) 2 % IJ SOLN
INTRAMUSCULAR | Status: AC
Start: 2023-07-21 — End: ?
  Filled 2023-07-21: qty 5

## 2023-07-21 MED ORDER — LIDOCAINE HCL (CARDIAC) PF 100 MG/5ML IV SOSY
PREFILLED_SYRINGE | INTRAVENOUS | Status: DC | PRN
  Administered 2023-07-21: 100 mg via INTRAVENOUS

## 2023-07-21 MED ORDER — PROPOFOL 500 MG/50ML IV EMUL
INTRAVENOUS | Status: DC | PRN
  Administered 2023-07-21: 165 ug/kg/min via INTRAVENOUS

## 2023-07-21 MED ORDER — SODIUM CHLORIDE 0.9 % IV SOLN: INTRAVENOUS | Status: DC

## 2023-07-21 MED ORDER — PROPOFOL 1000 MG/100ML IV EMUL
INTRAVENOUS | Status: AC
  Filled 2023-07-21: qty 100

## 2023-07-21 NOTE — Anesthesia Postprocedure Evaluation (Signed)
Anesthesia Post Note  Patient: Diana Wilkins  Procedure(s) Performed: COLONOSCOPY WITH PROPOFOL POLYPECTOMY  Patient location during evaluation: PACU Anesthesia Type: General Level of consciousness: awake Pain management: satisfactory to patient Vital Signs Assessment: post-procedure vital signs reviewed and stable Respiratory status: spontaneous breathing Cardiovascular status: stable Anesthetic complications: no   No notable events documented.   Last Vitals:  Vitals:   07/21/23 1419 07/21/23 1426  BP:    Pulse:    Resp: 20 16  Temp:    SpO2:      Last Pain:  Vitals:   07/21/23 1426  TempSrc:   PainSc: 0-No pain                 VAN STAVEREN,Aleja Yearwood

## 2023-07-21 NOTE — H&P (Signed)
Outpatient short stay form Pre-procedure 07/21/2023  Regis Bill, MD  Primary Physician: Myrene Buddy, NP  Reason for visit:  Abdominal pain  History of present illness:    67 y/o lady with history of IBS-C and hypertension here for colonoscopy for abdominal pain. No blood thinners. No family history of GI malignancies. History of cholecystectomy. Last colonoscopy was 6 months ago with poor prep.    Current Facility-Administered Medications:    0.9 %  sodium chloride infusion, , Intravenous, Continuous, Jesyka Slaght, Rossie Muskrat, MD, Last Rate: 20 mL/hr at 07/21/23 1333, New Bag at 07/21/23 1333  Medications Prior to Admission  Medication Sig Dispense Refill Last Dose   Cholecalciferol (VITAMIN D-3) 1000 units CAPS Take 1 capsule by mouth daily.   Past Week   losartan (COZAAR) 25 MG tablet Take 25 mg by mouth daily.   07/20/2023   oxybutynin (DITROPAN-XL) 5 MG 24 hr tablet Take 5 mg by mouth 2 (two) times daily.   07/20/2023   pantoprazole (PROTONIX) 40 MG tablet Take 40 mg by mouth daily.   07/20/2023   dicyclomine (BENTYL) 20 MG tablet Take 20 mg by mouth every 6 (six) hours. (Patient not taking: Reported on 07/21/2023)   Not Taking   docusate sodium (COLACE) 100 MG capsule Take 1 tablet once or twice daily as needed for constipation while taking narcotic pain medicine 30 capsule 0    omeprazole (PRILOSEC) 40 MG capsule Take 40 mg by mouth 2 (two) times daily.  (Patient not taking: Reported on 11/14/2022)      omeprazole (PRILOSEC) 40 MG capsule Take 1 capsule (40 mg total) by mouth daily. (Patient not taking: Reported on 11/14/2022) 30 capsule 0    ondansetron (ZOFRAN ODT) 4 MG disintegrating tablet Allow 1-2 tablets to dissolve in your mouth every 8 hours as needed for nausea/vomiting 30 tablet 0    ondansetron (ZOFRAN-ODT) 4 MG disintegrating tablet Take 1 tablet (4 mg total) by mouth every 8 (eight) hours as needed. 20 tablet 0      Allergies  Allergen Reactions    Erythromycin Nausea Only   Ciprofloxacin In D5w Rash     Past Medical History:  Diagnosis Date   GERD (gastroesophageal reflux disease)    Headache    H/O MIGRAINES   Hyperlipidemia    Hypertension    Stress incontinence     Review of systems:  Otherwise negative.    Physical Exam  Gen: Alert, oriented. Appears stated age.  HEENT: PERRLA. Lungs: No respiratory distress CV: RRR Abd: soft, benign, no masses Ext: No edema    Planned procedures: Proceed with colonoscopy. The patient understands the nature of the planned procedure, indications, risks, alternatives and potential complications including but not limited to bleeding, infection, perforation, damage to internal organs and possible oversedation/side effects from anesthesia. The patient agrees and gives consent to proceed.  Please refer to procedure notes for findings, recommendations and patient disposition/instructions.     Regis Bill, MD [facility]

## 2023-07-21 NOTE — Anesthesia Procedure Notes (Signed)
Procedure Name: General with mask airway Date/Time: 07/21/2023 1:45 PM  Performed by: Mohammed Kindle, CRNAPre-anesthesia Checklist: Patient identified, Emergency Drugs available, Suction available and Patient being monitored Patient Re-evaluated:Patient Re-evaluated prior to induction Oxygen Delivery Method: Simple face mask Induction Type: IV induction Placement Confirmation: positive ETCO2, CO2 detector and breath sounds checked- equal and bilateral Dental Injury: Teeth and Oropharynx as per pre-operative assessment

## 2023-07-21 NOTE — Transfer of Care (Signed)
Immediate Anesthesia Transfer of Care Note  Patient: Diana Wilkins  Procedure(s) Performed: COLONOSCOPY WITH PROPOFOL POLYPECTOMY  Patient Location: PACU  Anesthesia Type:MAC  Level of Consciousness: drowsy  Airway & Oxygen Therapy: Patient Spontanous Breathing  Post-op Assessment: Report given to RN and Post -op Vital signs reviewed and stable  Post vital signs: Reviewed and stable  Last Vitals:  Vitals Value Taken Time  BP 101/46 07/21/23 1410  Temp 36.9 C 07/21/23 1409  Pulse 75 07/21/23 1412  Resp 20 07/21/23 1409  SpO2 100 % 07/21/23 1412  Vitals shown include unfiled device data.  Last Pain:  Vitals:   07/21/23 1409  TempSrc: Temporal  PainSc: Asleep         Complications: No notable events documented.

## 2023-07-21 NOTE — Interval H&P Note (Signed)
History and Physical Interval Note:  07/21/2023 1:37 PM  Diana Wilkins  has presented today for surgery, with the diagnosis of abdominal pain.  The various methods of treatment have been discussed with the patient and family. After consideration of risks, benefits and other options for treatment, the patient has consented to  Procedure(s): COLONOSCOPY WITH PROPOFOL (N/A) as a surgical intervention.  The patient's history has been reviewed, patient examined, no change in status, stable for surgery.  I have reviewed the patient's chart and labs.  Questions were answered to the patient's satisfaction.     Regis Bill  Ok to proceed with colonoscopy

## 2023-07-21 NOTE — Anesthesia Preprocedure Evaluation (Signed)
Anesthesia Evaluation  Patient identified by MRN, date of birth, ID band Patient awake    Reviewed: Allergy & Precautions, NPO status , Patient's Chart, lab work & pertinent test results  Airway Mallampati: II  TM Distance: >3 FB Neck ROM: full    Dental  (+) Edentulous Upper, Edentulous Lower   Pulmonary neg pulmonary ROS, Patient abstained from smoking., former smoker   Pulmonary exam normal breath sounds clear to auscultation       Cardiovascular Exercise Tolerance: Good hypertension, Pt. on medications negative cardio ROS Normal cardiovascular exam Rhythm:Regular Rate:Normal     Neuro/Psych  Headaches negative neurological ROS  negative psych ROS   GI/Hepatic negative GI ROS, Neg liver ROS,GERD  Medicated,,  Endo/Other  negative endocrine ROS    Renal/GU negative Renal ROS  negative genitourinary   Musculoskeletal   Abdominal Normal abdominal exam  (+)   Peds negative pediatric ROS (+)  Hematology negative hematology ROS (+)   Anesthesia Other Findings Past Medical History: No date: GERD (gastroesophageal reflux disease) No date: Headache     Comment:  H/O MIGRAINES No date: Hyperlipidemia No date: Hypertension No date: Stress incontinence  Past Surgical History: No date: BREAST EXCISIONAL BIOPSY; Bilateral     Comment:  neg No date: BREAST SURGERY 01/19/2016: CHOLECYSTECTOMY; N/A     Comment:  Procedure: LAPAROSCOPIC CHOLECYSTECTOMY WITH               INTRAOPERATIVE CHOLANGIOGRAM;  Surgeon: Ricarda Frame,               MD;  Location: [facility] ORS;  Service: General;  Laterality:               N/A; 09/07/2006: COLONOSCOPY 12/26/2017: COLONOSCOPY WITH PROPOFOL; N/A     Comment:  Procedure: COLONOSCOPY WITH PROPOFOL;  Surgeon:               Christena Deem, MD;  Location: [facility] ENDOSCOPY;                Service: Endoscopy;  Laterality: N/A; 07/19/2022: COLONOSCOPY WITH PROPOFOL; N/A     Comment:   Procedure: COLONOSCOPY WITH PROPOFOL;  Surgeon:               Regis Bill, MD;  Location: [facility] ENDOSCOPY;                Service: Endoscopy;  Laterality: N/A; 11/14/2022: COLONOSCOPY WITH PROPOFOL; N/A     Comment:  Procedure: COLONOSCOPY WITH PROPOFOL;  Surgeon:               Regis Bill, MD;  Location: [facility] ENDOSCOPY;                Service: Endoscopy;  Laterality: N/A; 07/21/2014: ESOPHAGOGASTRODUODENOSCOPY 12/26/2017: ESOPHAGOGASTRODUODENOSCOPY (EGD) WITH PROPOFOL; N/A     Comment:  Procedure: ESOPHAGOGASTRODUODENOSCOPY (EGD) WITH               PROPOFOL;  Surgeon: Christena Deem, MD;  Location:               [facility] ENDOSCOPY;  Service: Endoscopy;  Laterality: N/A; 05/10/2019: ESOPHAGOGASTRODUODENOSCOPY (EGD) WITH PROPOFOL; N/A     Comment:  Procedure: ESOPHAGOGASTRODUODENOSCOPY (EGD) WITH               PROPOFOL;  Surgeon: Christena Deem, MD;  Location:               [facility] ENDOSCOPY;  Service: Endoscopy;  Laterality: N/A; 07/19/2022: ESOPHAGOGASTRODUODENOSCOPY (EGD) WITH PROPOFOL;  N/A     Comment:  Procedure: ESOPHAGOGASTRODUODENOSCOPY (EGD) WITH               PROPOFOL;  Surgeon: Regis Bill, MD;  Location:               [facility] ENDOSCOPY;  Service: Endoscopy;  Laterality: N/A; 1970: TONSILLECTOMY No date: TUBAL LIGATION  BMI    Body Mass Index: 21.99 kg/m      Reproductive/Obstetrics negative OB ROS                             Anesthesia Physical Anesthesia Plan  ASA: 2  Anesthesia Plan: General   Post-op Pain Management:    Induction: Intravenous  PONV Risk Score and Plan: Propofol infusion and TIVA  Airway Management Planned: Natural Airway and Nasal Cannula  Additional Equipment:   Intra-op Plan:   Post-operative Plan:   Informed Consent: I have reviewed the patients History and Physical, chart, labs and discussed the procedure including the risks, benefits and alternatives for the proposed anesthesia with the  patient or authorized representative who has indicated his/her understanding and acceptance.     Dental Advisory Given  Plan Discussed with: CRNA and Surgeon  Anesthesia Plan Comments:        Anesthesia Quick Evaluation

## 2023-07-21 NOTE — Op Note (Signed)
[facility] Patient Name: Diana Wilkins Procedure Date: 07/21/2023 1:35 PM MRN: [id number] Account #: [id number] Date of Birth: 08-26-1956 Admit Type: Outpatient Age: 67 Room: [facility] ENDO ROOM 3 Gender: Female Note Status: Finalized Instrument Name: Prentice Docker [id number] Procedure:             Colonoscopy Indications:           Generalized abdominal pain Providers:             Eather Colas MD, MD Referring MD:          Eather Colas MD, MD (Referring MD), Caryl Asp (Referring MD) Medicines:             Monitored Anesthesia Care Complications:         No immediate complications. Estimated blood loss:                         Minimal. Procedure:             Pre-Anesthesia Assessment:                        - Prior to the procedure, a History and Physical was                         performed, and patient medications and allergies were                         reviewed. The patient is competent. The risks and                         benefits of the procedure and the sedation options and                         risks were discussed with the patient. All questions                         were answered and informed consent was obtained.                         Patient identification and proposed procedure were                         verified by the physician, the nurse, the                         anesthesiologist, the anesthetist and the technician                         in the endoscopy suite. Mental Status Examination:                         alert and oriented. Airway Examination: normal                         oropharyngeal airway and neck mobility. Respiratory  Examination: clear to auscultation. CV Examination:                         normal. Prophylactic Antibiotics: The patient does not                         require prophylactic antibiotics. Prior                          Anticoagulants: The patient has taken no anticoagulant                         or antiplatelet agents. ASA Grade Assessment: II - A                         patient with mild systemic disease. After reviewing                         the risks and benefits, the patient was deemed in                         satisfactory condition to undergo the procedure. The                         anesthesia plan was to use monitored anesthesia care                         (MAC). Immediately prior to administration of                         medications, the patient was re-assessed for adequacy                         to receive sedatives. The heart rate, respiratory                         rate, oxygen saturations, blood pressure, adequacy of                         pulmonary ventilation, and response to care were                         monitored throughout the procedure. The physical                         status of the patient was re-assessed after the                         procedure.                        After obtaining informed consent, the colonoscope was                         passed under direct vision. Throughout the procedure,                         the patient's blood pressure, pulse, and oxygen  saturations were monitored continuously. The                         Colonoscope was introduced through the anus and                         advanced to the the terminal ileum. The colonoscopy                         was somewhat difficult due to a redundant colon and                         significant looping. Successful completion of the                         procedure was aided by applying abdominal pressure.                         The patient tolerated the procedure well. The quality                         of the bowel preparation was excellent. The terminal                         ileum, ileocecal valve, appendiceal orifice, and                         rectum were  photographed. Findings:      The perianal and digital rectal examinations were normal.      The terminal ileum appeared normal.      A 1 mm polyp was found in the cecum. The polyp was sessile. The polyp       was removed with a jumbo cold forceps. Resection and retrieval were       complete. Estimated blood loss was minimal.      Internal hemorrhoids were found during retroflexion. The hemorrhoids       were Grade I (internal hemorrhoids that do not prolapse).      The exam was otherwise without abnormality on direct and retroflexion       views. Impression:            - The examined portion of the ileum was normal.                        - One 1 mm polyp in the cecum, removed with a jumbo                         cold forceps. Resected and retrieved.                        - Internal hemorrhoids.                        - The examination was otherwise normal on direct and                         retroflexion views. Recommendation:        - Discharge patient to home.                        -  Resume previous diet.                        - Continue present medications.                        - Await pathology results.                        - Repeat colonoscopy in 7-10 years for surveillance.                        - Return to referring physician as previously                         scheduled. Procedure Code(s):     --- Professional ---                        [phone], Colonoscopy, flexible; with biopsy, single or                         multiple Diagnosis Code(s):     --- Professional ---                        K64.0, First degree hemorrhoids                        D12.0, Benign neoplasm of cecum                        R10.84, Generalized abdominal pain CPT copyright 2022 American Medical Association. All rights reserved. The codes documented in this report are preliminary and upon coder review may  be revised to meet current compliance requirements. Eather Colas MD, MD 07/21/2023  2:11:04 PM Number of Addenda: 0 Note Initiated On: 07/21/2023 1:35 PM Scope Withdrawal Time: 0 hours 8 minutes 42 seconds  Total Procedure Duration: 0 hours 17 minutes 44 seconds  Estimated Blood Loss:  Estimated blood loss was minimal.      [facility]

## 2023-07-25 ENCOUNTER — Encounter: Payer: Self-pay | Admitting: Gastroenterology

## 2023-07-26 LAB — SURGICAL PATHOLOGY

## 2023-08-17 ENCOUNTER — Other Ambulatory Visit: Payer: Self-pay | Admitting: Acute Care

## 2023-08-17 DIAGNOSIS — Z87891 Personal history of nicotine dependence: Secondary | ICD-10-CM

## 2023-08-17 DIAGNOSIS — Z122 Encounter for screening for malignant neoplasm of respiratory organs: Secondary | ICD-10-CM

## 2023-10-17 ENCOUNTER — Ambulatory Visit
Admission: RE | Admit: 2023-10-17 | Discharge: 2023-10-17 | Disposition: A | Discharge status: Home / Self Care | Payer: Medicare HMO | Source: Ambulatory Visit | Attending: Nurse Practitioner | Admitting: Nurse Practitioner

## 2023-10-17 DIAGNOSIS — Z122 Encounter for screening for malignant neoplasm of respiratory organs: Secondary | ICD-10-CM | POA: Diagnosis present

## 2023-10-17 DIAGNOSIS — Z87891 Personal history of nicotine dependence: Secondary | ICD-10-CM | POA: Diagnosis present

## 2023-10-17 DIAGNOSIS — Z1231 Encounter for screening mammogram for malignant neoplasm of breast: Secondary | ICD-10-CM | POA: Diagnosis present

## 2023-10-24 ENCOUNTER — Other Ambulatory Visit: Payer: Self-pay | Admitting: Acute Care

## 2023-10-24 DIAGNOSIS — Z87891 Personal history of nicotine dependence: Secondary | ICD-10-CM

## 2023-10-24 DIAGNOSIS — Z122 Encounter for screening for malignant neoplasm of respiratory organs: Secondary | ICD-10-CM

## 2023-12-28 ENCOUNTER — Other Ambulatory Visit: Payer: Self-pay | Admitting: Podiatry

## 2024-01-04 ENCOUNTER — Encounter: Payer: Self-pay | Admitting: Podiatry

## 2024-01-08 NOTE — Discharge Instructions (Signed)
 [facility]  POST OPERATIVE INSTRUCTIONS FOR DR. Althea Atkinson AND DR. BAKER [facility] CLINIC PODIATRY DEPARTMENT   Take your medication as prescribed.  Pain medication should be taken only as needed.  Keep the dressing clean, dry and intact.  Keep your foot elevated above the heart level for the first 48 hours.  Walking to the bathroom and brief periods of walking are acceptable, unless we have instructed you to be non-weight bearing.  Always wear your post-op shoe when walking.  Always use your crutches if you are to be non-weight bearing.  Do not take a shower. Baths are permissible as long as the foot is kept out of the water.   Every hour you are awake:  Bend your knee 15 times. Flex foot 15 times Massage calf 15 times  Call [facility] [phone]) if any of the following problems occur: You develop a temperature or fever. The bandage becomes saturated with blood. Medication does not stop your pain. Injury of the foot occurs. Any symptoms of infection including redness, odor, or red streaks running from wound.

## 2024-01-10 ENCOUNTER — Other Ambulatory Visit: Payer: Self-pay

## 2024-01-10 ENCOUNTER — Ambulatory Visit
Admission: RE | Admit: 2024-01-10 | Discharge: 2024-01-10 | Disposition: A | Discharge status: Home / Self Care | Attending: Podiatry | Admitting: Podiatry

## 2024-01-10 ENCOUNTER — Ambulatory Visit: Payer: Self-pay | Admitting: Anesthesiology

## 2024-01-10 ENCOUNTER — Ambulatory Visit: Payer: Self-pay

## 2024-01-10 ENCOUNTER — Encounter: Payer: Self-pay | Admitting: Podiatry

## 2024-01-10 ENCOUNTER — Encounter
Admission: RE | Disposition: A | Discharge status: Home / Self Care | Payer: Self-pay | Source: Home / Self Care | Attending: Podiatry

## 2024-01-10 DIAGNOSIS — K449 Diaphragmatic hernia without obstruction or gangrene: Secondary | ICD-10-CM | POA: Insufficient documentation

## 2024-01-10 DIAGNOSIS — I10 Essential (primary) hypertension: Secondary | ICD-10-CM | POA: Diagnosis not present

## 2024-01-10 DIAGNOSIS — M21621 Bunionette of right foot: Secondary | ICD-10-CM | POA: Diagnosis present

## 2024-01-10 DIAGNOSIS — M2041 Other hammer toe(s) (acquired), right foot: Secondary | ICD-10-CM | POA: Diagnosis present

## 2024-01-10 DIAGNOSIS — K219 Gastro-esophageal reflux disease without esophagitis: Secondary | ICD-10-CM | POA: Diagnosis not present

## 2024-01-10 DIAGNOSIS — Z87891 Personal history of nicotine dependence: Secondary | ICD-10-CM | POA: Diagnosis not present

## 2024-01-10 DIAGNOSIS — M899 Disorder of bone, unspecified: Secondary | ICD-10-CM | POA: Diagnosis not present

## 2024-01-10 DIAGNOSIS — M2011 Hallux valgus (acquired), right foot: Secondary | ICD-10-CM | POA: Insufficient documentation

## 2024-01-10 HISTORY — PX: HALLUX VALGUS LAPIDUS: SHX6626

## 2024-01-10 HISTORY — DX: Diaphragmatic hernia without obstruction or gangrene: K44.9

## 2024-01-10 HISTORY — PX: HAMMER TOE SURGERY: SHX385

## 2024-01-10 HISTORY — PX: CAPSULOTOMY METATARSOPHALANGEAL: SHX6614

## 2024-01-10 HISTORY — PX: BONE EXOSTOSIS EXCISION: SHX1249

## 2024-01-10 SURGERY — BUNIONECTOMY, LAPIDUS
Anesthesia: General | Site: Toe | Laterality: Right

## 2024-01-10 MED ORDER — ONDANSETRON HCL 4 MG/2ML IJ SOLN
INTRAMUSCULAR | Status: AC
  Filled 2024-01-10: qty 2

## 2024-01-10 MED ORDER — PROPOFOL 10 MG/ML IV BOLUS
INTRAVENOUS | Status: DC | PRN
  Administered 2024-01-10: 120 mg via INTRAVENOUS

## 2024-01-10 MED ORDER — ROCURONIUM BROMIDE 100 MG/10ML IV SOLN
INTRAVENOUS | Status: DC | PRN
  Administered 2024-01-10: 10 mg via INTRAVENOUS

## 2024-01-10 MED ORDER — DEXAMETHASONE SODIUM PHOSPHATE 4 MG/ML IJ SOLN
INTRAMUSCULAR | Status: DC | PRN
  Administered 2024-01-10: 4 mg via INTRAVENOUS

## 2024-01-10 MED ORDER — ONDANSETRON HCL 4 MG/2ML IJ SOLN
INTRAMUSCULAR | Status: DC | PRN
  Administered 2024-01-10: 4 mg via INTRAVENOUS

## 2024-01-10 MED ORDER — MIDAZOLAM HCL 2 MG/2ML IJ SOLN
INTRAMUSCULAR | Status: AC
  Filled 2024-01-10: qty 2

## 2024-01-10 MED ORDER — MIDAZOLAM HCL 5 MG/5ML IJ SOLN
INTRAMUSCULAR | Status: DC | PRN
  Administered 2024-01-10: 2 mg via INTRAVENOUS

## 2024-01-10 MED ORDER — BUPIVACAINE LIPOSOME 1.3 % IJ SUSP
INTRAMUSCULAR | Status: AC
  Filled 2024-01-10: qty 10

## 2024-01-10 MED ORDER — BUPIVACAINE HCL (PF) 0.25 % IJ SOLN
INTRAMUSCULAR | Status: DC | PRN
  Administered 2024-01-10: 10 mL

## 2024-01-10 MED ORDER — PROPOFOL 10 MG/ML IV BOLUS
INTRAVENOUS | Status: AC
  Filled 2024-01-10: qty 20

## 2024-01-10 MED ORDER — SUCCINYLCHOLINE CHLORIDE 200 MG/10ML IV SOSY
PREFILLED_SYRINGE | INTRAVENOUS | Status: DC | PRN
  Administered 2024-01-10: 80 mg via INTRAVENOUS

## 2024-01-10 MED ORDER — LIDOCAINE HCL (PF) 2 % IJ SOLN
INTRAMUSCULAR | Status: AC
  Filled 2024-01-10: qty 5

## 2024-01-10 MED ORDER — FENTANYL CITRATE (PF) 100 MCG/2ML IJ SOLN
INTRAMUSCULAR | Status: AC
  Filled 2024-01-10: qty 2

## 2024-01-10 MED ORDER — SODIUM CHLORIDE 0.9 % IR SOLN
Status: DC | PRN
  Administered 2024-01-10: 1000 mL

## 2024-01-10 MED ORDER — DEXAMETHASONE SODIUM PHOSPHATE 4 MG/ML IJ SOLN
INTRAMUSCULAR | Status: AC
Start: 2024-01-10 — End: ?
  Filled 2024-01-10: qty 1

## 2024-01-10 MED ORDER — LIDOCAINE HCL (CARDIAC) PF 100 MG/5ML IV SOSY
PREFILLED_SYRINGE | INTRAVENOUS | Status: DC | PRN
  Administered 2024-01-10: 100 mg via INTRAVENOUS

## 2024-01-10 MED ORDER — BUPIVACAINE LIPOSOME 1.3 % IJ SUSP
INTRAMUSCULAR | Status: DC | PRN
  Administered 2024-01-10: 10 mL

## 2024-01-10 MED ORDER — FENTANYL CITRATE (PF) 100 MCG/2ML IJ SOLN
INTRAMUSCULAR | Status: DC | PRN
Start: 2024-01-10 — End: 2024-01-10
  Administered 2024-01-10 (×2): 25 ug via INTRAVENOUS
  Administered 2024-01-10: 50 ug via INTRAVENOUS

## 2024-01-10 MED ORDER — CEFAZOLIN SODIUM-DEXTROSE 2-4 GM/100ML-% IV SOLN
2.0000 g | INTRAVENOUS | Status: AC
  Administered 2024-01-10: 2 g via INTRAVENOUS

## 2024-01-10 MED ORDER — CEFAZOLIN SODIUM-DEXTROSE 2-3 GM-%(50ML) IV SOLR
INTRAVENOUS | Status: AC
  Filled 2024-01-10: qty 50

## 2024-01-10 MED ORDER — LACTATED RINGERS IV SOLN: INTRAVENOUS | Status: DC

## 2024-01-10 MED ORDER — OXYCODONE HCL 5 MG PO TABS: 10.0000 mg | ORAL_TABLET | Freq: Once | ORAL | Status: DC

## 2024-01-10 MED ORDER — OXYCODONE-ACETAMINOPHEN 5-325 MG PO TABS: 1.0000 | ORAL_TABLET | Freq: Four times a day (QID) | ORAL | 0 refills | Status: AC | PRN

## 2024-01-10 SURGICAL SUPPLY — 36 items
BENZOIN TINCTURE PRP APPL 2/3 (GAUZE/BANDAGES/DRESSINGS) ×3 IMPLANT
BLADE OSC/SAGITTAL MD 5.5X18 (BLADE) ×1 IMPLANT
BLADE SAW LAPIPLASTY 40X11 (BLADE) ×1 IMPLANT
BLADE SURG 15 STRL LF DISP TIS (BLADE) ×2 IMPLANT
BNDG COHESIVE 4X5 TAN STRL LF (GAUZE/BANDAGES/DRESSINGS) ×3 IMPLANT
BNDG ELASTIC 4X5.8 VLCR NS LF (GAUZE/BANDAGES/DRESSINGS) ×3 IMPLANT
BNDG ESMARCH 4X12 STRL LF (GAUZE/BANDAGES/DRESSINGS) ×3 IMPLANT
BNDG GAUZE DERMACEA FLUFF 4 (GAUZE/BANDAGES/DRESSINGS) ×3 IMPLANT
BNDG STRETCH 4X75 STRL LF (GAUZE/BANDAGES/DRESSINGS) ×3 IMPLANT
BOOT STEPPER DURA MED (SOFTGOODS) ×1 IMPLANT
CANISTER SUCT 1200ML W/VALVE (MISCELLANEOUS) ×3 IMPLANT
COVER LIGHT HANDLE UNIVERSAL (MISCELLANEOUS) ×6 IMPLANT
DRAPE FLUOR MINI C-ARM 54X84 (DRAPES) ×3 IMPLANT
DURAPREP 26ML APPLICATOR (WOUND CARE) ×3 IMPLANT
ELECTRODE REM PT RTRN 9FT ADLT (ELECTROSURGICAL) ×3 IMPLANT
GAUZE SPONGE 4X4 12PLY STRL (GAUZE/BANDAGES/DRESSINGS) ×3 IMPLANT
GAUZE XEROFORM 1X8 LF (GAUZE/BANDAGES/DRESSINGS) ×3 IMPLANT
GLOVE BIOGEL PI IND STRL 8 (GLOVE) ×6 IMPLANT
GLOVE SURG SS PI 7.5 STRL IVOR (GLOVE) ×6 IMPLANT
GOWN SPEC L4 XLG W/TWL (GOWN DISPOSABLE) ×3 IMPLANT
GOWN STRL REUS W/ TWL LRG LVL3 (GOWN DISPOSABLE) ×3 IMPLANT
GUIDEWIRE 1.1 DT 2-ZONE (WIRE) ×1 IMPLANT
KIT TURNOVER KIT A (KITS) ×3 IMPLANT
NS IRRIG 500ML POUR BTL (IV SOLUTION) ×3 IMPLANT
PACK EXTREMITY ARMC (MISCELLANEOUS) ×3 IMPLANT
PIN BALLS 3/8 F/.045 WIRE (MISCELLANEOUS) ×3 IMPLANT
RASP SM TEAR CROSS CUT (RASP) ×1 IMPLANT
SCREW 2.7 HIGH PITCH LOCKING (Screw) ×1 IMPLANT
SCREW HIGH PITCH LOCK 2.7 (Screw) ×1 IMPLANT
STOCKINETTE IMPERVIOUS LG (DRAPES) ×3 IMPLANT
STRIP CLOSURE SKIN 1/4X4 (GAUZE/BANDAGES/DRESSINGS) ×3 IMPLANT
SUT VIC AB 3-0 SH 27X BRD (SUTURE) ×1 IMPLANT
SUT VIC AB 4-0 SH 27XANBCTRL (SUTURE) ×2 IMPLANT
SUTURE EHLN 3-0 FS-10 30 BLK (SUTURE) ×1 IMPLANT
SUTURE MNCRL 4-0 27XMF (SUTURE) ×3 IMPLANT
SYSTEM LAPIPLASTY 4A (Orthopedic Implant) ×1 IMPLANT

## 2024-01-10 NOTE — Anesthesia Preprocedure Evaluation (Addendum)
 Anesthesia Evaluation  Patient identified by MRN, date of birth, ID band Patient awake    Reviewed: Allergy & Precautions, H&P , NPO status , Patient's Chart, lab work & pertinent test results  Airway Mallampati: IV  TM Distance: <3 FB Neck ROM: Full    Dental no notable dental hx. (+) Lower Dentures, Upper Dentures   Pulmonary former smoker   Pulmonary exam normal breath sounds clear to auscultation       Cardiovascular hypertension, Normal cardiovascular exam Rhythm:Regular Rate:Normal     Neuro/Psych  Headaches  Neuromuscular disease  negative psych ROS   GI/Hepatic Neg liver ROS, hiatal hernia,GERD  ,,  Endo/Other  negative endocrine ROS    Renal/GU negative Renal ROS  negative genitourinary   Musculoskeletal negative musculoskeletal ROS (+)    Abdominal   Peds negative pediatric ROS (+)  Hematology negative hematology ROS (+)   Anesthesia Other Findings GERD (gastroesophageal reflux disease) Hypertension Hyperlipidemia  Stress incontinence Headache     Reproductive/Obstetrics negative OB ROS                             Anesthesia Physical Anesthesia Plan  ASA: 2  Anesthesia Plan: General ETT   Post-op Pain Management:    Induction: Intravenous  PONV Risk Score and Plan:   Airway Management Planned: Oral ETT  Additional Equipment:   Intra-op Plan:   Post-operative Plan: Extubation in OR  Informed Consent: I have reviewed the patients History and Physical, chart, labs and discussed the procedure including the risks, benefits and alternatives for the proposed anesthesia with the patient or authorized representative who has indicated his/her understanding and acceptance.     Dental Advisory Given  Plan Discussed with: Anesthesiologist, CRNA and Surgeon  Anesthesia Plan Comments: (Patient consented for risks of anesthesia including but not limited to:  - adverse  reactions to medications - damage to eyes, teeth, lips or other oral mucosa - nerve damage due to positioning  - sore throat or hoarseness - Damage to heart, brain, nerves, lungs, other parts of body or loss of life  Patient voiced understanding and assent.)       Anesthesia Quick Evaluation

## 2024-01-10 NOTE — Op Note (Signed)
 Operative note   Surgeon:Darien Mignogna [employer] forces logistics/support/administrative officer: None    Preop diagnosis: 1.  Hallux valgus deformity right foot 2.  Hammertoe contracture right second toe 3.  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  Lapidus hallux valgus correction right foot 2. PIPJ arthrodesis right second toe 3. MTPJ capsulotomy right second MTPJ 4.  Plantar exostectomy right fifth metatarsal head 5.  Intraoperative fluoroscopy use without assistance of radiologist    EBL: Minimal    Anesthesia:local and general.  Local consist of a total of 20 cc of one-to-one mixture of 0.25% bupivacaine  plain and Exparel  long-acting anesthetic and filtrated along all surgical site    Hemostasis: Mid calf tourniquet inflated to 200 mmHg for 97 minutes    Specimen: None    Complications: None    Operative indications:Diana Wilkins is an 68 y.o. that presents today for surgical intervention.  The risks/benefits/alternatives/complications have been discussed and consent has been given.    Procedure:  Patient was brought into the OR and placed on the operating table in thesupine position. After anesthesia was obtained theright lower extremity was prepped and draped in usual sterile fashion.  Attention was directed to the dorsal medial right first MTPJ.  Incision was placed down to the capsule.  The intermetatarsal space was entered.  The DTI L was transected.  The conjoined tendon of the abductor was released.  Attention was redirected to the first metatarsocuneiform joint where the joint was released.  The the joint positioner was then placed on the first metatarsal and a small stab incision was made along the midshaft of the second metatarsal for compression.  The fulcrum was placed between the 1st and 2nd metatarsals.  The position was used to derotate and realign the first metatarsal into a better aligned position.  Next the joint seeker was placed and the cut guide was  placed dorsally.  2 converging cuts were performed.  The fragments of the joint were removed.  Next this was drilled and prepped.  Compression was applied to the joint compressor.  A olive oil was applied across.  The dorsal and medial plates were then placed using standard technique.  Good realignment of the intermetatarsal angle was noted.  Attention was then directed to the dorsal medial first MTPJ.  The capsule was opened in a T fashion.  A small capsulorrhaphy was performed.  The dorsomedial eminence was transected and smooth with a power rasp and power saw.  Flush of all areas was performed.  Closure was performed with a 3-0 Vicryl the capsule with the toe in a better realign position.  Subcutaneous tissue closed with a 4-0 Vicryl and the skin with a 4-0 Monocryl.  Attention was directed to the second toe at the PIPJ where incision was placed at the PIPJ crossing the MTPJ in a curvilinear fashion.  Sharp and blunt dissection carried down to the extensor tendon.  This was released and brought proximal.  The head of the proximal phalanx was excised just proximal to the surgical neck.  The base of the middle phalanx was removed at this time.  The toe was still in a slightly dorsal position.  The dorsal medial and lateral capsule was then incised.  The toe was in a much better position at this point.  A 0.045 K wire was driven from the base of the middle phalanx through the tip of the toe and then crossing back over  the joint to the base of the proximal phalanx crossing the MTPJ in an anatomically aligned position in neutral position for the toe.  Good alignment was noted.  Wound was flushed with copious amounts of irrigation.  4 Vicryl was used to close the extensor tendon and subcutaneous tissue.  The skin was closed with a 4-0 Monocryl.  Attention was directed laterally along the fifth metatarsophalangeal joint where a lateral incision was performed.  Sharp and bluntdissection was carried down to the  capsule.  A longitudinal capsulotomy was performed.  The plantar aspect of the metatarsal head was noted and transected with a power saw and smoothed with a power rasp.  This was directly deep to the painful hyperkeratotic pore keratomas lesion.  The wound was flushed with copious amounts of irrigation.  Closure was performed with a 4-0 Vicryl the capsular tissue and subcutaneous tissue and a 3-0 nylon for the skin.  Multiple views with fluoroscopy were used to demonstrate realignment and good positioning of all sites.  A bulky sterile dressing was applied.  A equalizer walker boot was then applied to the foot.     Patient tolerated the procedure and anesthesia well.  Was transported from the OR to the PACU with all vital signs stable and vascular status intact. To be discharged per routine protocol.  Will follow up in approximately 1 week in the outpatient clinic.

## 2024-01-10 NOTE — H&P (Signed)
 HISTORY AND PHYSICAL INTERVAL NOTE:  01/10/2024  11:13 AM  Diana Wilkins  has presented today for surgery, with the diagnosis of Hallux valgus, right Hammer toe of right foot Tailor's bunion of right foot.  The various methods of treatment have been discussed with the patient.  No guarantees were given.  After consideration of risks, benefits and other options for treatment, the patient has consented to surgery.  I have reviewed the patients' chart and labs.     A history and physical examination was performed in my office.  The patient was reexamined.  There have been no changes to this history and physical examination.  Diana Wilkins A

## 2024-01-10 NOTE — Anesthesia Postprocedure Evaluation (Signed)
 Anesthesia Post Note  Patient: Diana Wilkins  Procedure(s) Performed: Bonna Bustard (Right: Foot) EXCISION, EXOSTOSIS (Right: Foot) CORRECTION, HAMMER TOE (Right: Toe) CAPSULOTOMY, MTP JOINT (Right: Foot)  Patient location during evaluation: PACU Anesthesia Type: General Level of consciousness: awake and alert Pain management: pain level controlled Vital Signs Assessment: post-procedure vital signs reviewed and stable Respiratory status: spontaneous breathing, nonlabored ventilation, respiratory function stable and patient connected to nasal cannula oxygen Cardiovascular status: blood pressure returned to baseline and stable Postop Assessment: no apparent nausea or vomiting Anesthetic complications: no   No notable events documented.   Last Vitals:  Vitals:   01/10/24 1345 01/10/24 1400  BP: 132/65 127/64  Pulse: 78 68  Resp: (!) 21 19  Temp:  (!) 36.3 C  SpO2: 98% 97%    Last Pain:  Vitals:   01/10/24 1400  PainSc: 0-No pain                 Ellarose Brandi C Maycie Luera

## 2024-01-10 NOTE — Transfer of Care (Signed)
 Immediate Anesthesia Transfer of Care Note  Patient: Diana Wilkins  Procedure(s) Performed: Bonna Bustard (Right: Foot) BUNIONECTOMY, WITH METATARSAL OSTEOTOMY (Right: Toe) EXCISION, EXOSTOSIS (Right: Foot) CORRECTION, HAMMER TOE (Right: Toe)  Patient Location: PACU  Anesthesia Type: General ETT  Level of Consciousness: awake, alert  and patient cooperative  Airway and Oxygen Therapy: Patient Spontanous Breathing and Patient connected to supplemental oxygen  Post-op Assessment: Post-op Vital signs reviewed, Patient's Cardiovascular Status Stable, Respiratory Function Stable, Patent Airway and No signs of Nausea or vomiting  Post-op Vital Signs: Reviewed and stable  Complications: No notable events documented.

## 2024-01-12 ENCOUNTER — Encounter: Payer: Self-pay | Admitting: Podiatry
